# Patient Record
Sex: Male | Born: 1975 | Race: White | Hispanic: No | Marital: Single | Smoking: Former smoker
Health system: Southern US, Community
[De-identification: ages and names within clinical notes are randomized; demographics above are authoritative.]

## PROBLEM LIST (undated history)

## (undated) DIAGNOSIS — K219 Gastro-esophageal reflux disease without esophagitis: Secondary | ICD-10-CM

## (undated) DIAGNOSIS — I10 Essential (primary) hypertension: Secondary | ICD-10-CM

## (undated) DIAGNOSIS — I2510 Atherosclerotic heart disease of native coronary artery without angina pectoris: Secondary | ICD-10-CM

## (undated) DIAGNOSIS — E785 Hyperlipidemia, unspecified: Secondary | ICD-10-CM

## (undated) HISTORY — DX: Atherosclerotic heart disease of native coronary artery without angina pectoris: I25.10

## (undated) HISTORY — DX: Gastro-esophageal reflux disease without esophagitis: K21.9

## (undated) HISTORY — DX: Essential (primary) hypertension: I10

## (undated) HISTORY — DX: Hyperlipidemia, unspecified: E78.5

---

## 2008-08-10 ENCOUNTER — Emergency Department (HOSPITAL_COMMUNITY): Admission: EM | Admit: 2008-08-10 | Discharge: 2008-08-10 | Payer: Self-pay | Admitting: Emergency Medicine

## 2009-11-06 ENCOUNTER — Encounter: Admission: RE | Admit: 2009-11-06 | Discharge: 2009-11-06 | Payer: Self-pay | Admitting: Neurosurgery

## 2010-01-14 HISTORY — PX: MICRODISCECTOMY LUMBAR: SUR864

## 2010-07-29 ENCOUNTER — Other Ambulatory Visit: Payer: Self-pay | Admitting: Neurosurgery

## 2010-07-29 DIAGNOSIS — M545 Low back pain, unspecified: Secondary | ICD-10-CM

## 2010-07-29 DIAGNOSIS — M79672 Pain in left foot: Secondary | ICD-10-CM

## 2010-08-01 ENCOUNTER — Ambulatory Visit
Admission: RE | Admit: 2010-08-01 | Discharge: 2010-08-01 | Disposition: A | Discharge status: Home / Self Care | Payer: BC Managed Care – PPO | Source: Ambulatory Visit | Attending: Neurosurgery | Admitting: Neurosurgery

## 2010-08-01 DIAGNOSIS — M545 Low back pain, unspecified: Secondary | ICD-10-CM

## 2010-08-01 DIAGNOSIS — M79672 Pain in left foot: Secondary | ICD-10-CM

## 2010-08-01 MED ORDER — GADOBENATE DIMEGLUMINE 529 MG/ML IV SOLN
17.0000 mL | Freq: Once | INTRAVENOUS | Status: AC | PRN
  Administered 2010-08-01: 17 mL via INTRAVENOUS

## 2014-02-07 ENCOUNTER — Ambulatory Visit (INDEPENDENT_AMBULATORY_CARE_PROVIDER_SITE_OTHER): Payer: BLUE CROSS/BLUE SHIELD | Admitting: Family Medicine

## 2014-02-07 VITALS — BP 120/90 | HR 65 | Temp 97.9°F | Resp 16 | Ht 70.5 in | Wt 189.8 lb

## 2014-02-07 DIAGNOSIS — H6123 Impacted cerumen, bilateral: Secondary | ICD-10-CM

## 2014-02-07 NOTE — Progress Notes (Signed)
° °  Subjective:   This chart was scribed for Steve Haber, MD by Forrestine Him, [facility] Scribe. This patient was seen in room 10 and the patient's care was started 6:43 PM.    Patient ID: Steve Bennett, male    DOB: 12/06/1975, 38 y.o.   MRN: [id number]  Chief Complaint  Patient presents with   Ear Fullness    right ear feels clogged up x 1 week.  used otc meds but this did not help    HPI  HPI Comments: Steve Bennett is a 38 y.o. male without any pertinent medical history who presents to [facility] complaining of a constant, moderate clogged R ear onset 1 week that is unchanged. No recent swimming. Pt has tried OTC peroxide and rinses without any improvement for symptoms. She denies any fever, chills, or other associated symptoms. No known allergies to medications. No other concerns this visit.  Pt works for [employer] as an Chief Financial Officer.  There are no active problems to display for this patient.  History reviewed. No pertinent past medical history. History reviewed. No pertinent past surgical history. Not on File Prior to Admission medications   Not on File     Review of Systems  Constitutional: Negative for fever and chills.  HENT: Positive for ear pain. Negative for congestion.     Triage Vitals: BP 120/90 mmHg   Pulse 65   Temp(Src) 97.9 F (36.6 C) (Oral)   Resp 16   Ht 5' 10.5" (1.791 m)   Wt 189 lb 12.8 oz (86.093 kg)   BMI 26.84 kg/m2   SpO2 98%   Objective:   Physical Exam  Constitutional: He is oriented to person, place, and time. He appears well-developed and well-nourished.  HENT:  Head: Normocephalic.  Eyes: EOM are normal.  Neck: Normal range of motion.  Pulmonary/Chest: Effort normal.  Abdominal: He exhibits no distension.  Musculoskeletal: Normal range of motion.  Neurological: He is alert and oriented to person, place, and time.  Psychiatric: He has a normal mood and affect.  Nursing note and  vitals reviewed.      Assessment & Plan:   I personally performed the services described in this documentation, which was scribed in my presence. The recorded information has been reviewed and is accurate.   I spent 15 minutes lavaging the ear until finally both ears were cleared.  Resolved cerumen impaction  Signed, Carola Frost.D.

## 2014-02-07 NOTE — Patient Instructions (Signed)
Cerumen Impaction °A cerumen impaction is when the wax in your ear forms a plug. This plug usually causes reduced hearing. Sometimes it also causes an earache or dizziness. Removing a cerumen impaction can be difficult and painful. The wax sticks to the ear canal. The canal is sensitive and bleeds easily. If you try to remove a heavy wax buildup with a cotton tipped swab, you may push it in further. °Irrigation with water, suction, and small ear curettes may be used to clear out the wax. If the impaction is fixed to the skin in the ear canal, ear drops may be needed for a few days to loosen the wax. People who build up a lot of wax frequently can use ear wax removal products available in your local drugstore. °SEEK MEDICAL CARE IF:  °You develop an earache, increased hearing loss, or marked dizziness. °Document Released: 04/09/2004 Document Revised: 05/25/2011 Document Reviewed: 05/30/2009 °ExitCare® Patient Information ©2015 [employer]. This information is not intended to replace advice given to you by your health care provider. Make sure you discuss any questions you have with your health care provider. ° °

## 2015-05-03 ENCOUNTER — Encounter: Payer: Self-pay | Admitting: Physician Assistant

## 2015-05-13 ENCOUNTER — Ambulatory Visit (INDEPENDENT_AMBULATORY_CARE_PROVIDER_SITE_OTHER): Payer: BLUE CROSS/BLUE SHIELD | Admitting: Physician Assistant

## 2015-05-13 ENCOUNTER — Other Ambulatory Visit (INDEPENDENT_AMBULATORY_CARE_PROVIDER_SITE_OTHER): Payer: BLUE CROSS/BLUE SHIELD

## 2015-05-13 ENCOUNTER — Encounter: Payer: Self-pay | Admitting: Physician Assistant

## 2015-05-13 VITALS — BP 128/84 | HR 68 | Ht 70.5 in | Wt 195.4 lb

## 2015-05-13 DIAGNOSIS — R194 Change in bowel habit: Secondary | ICD-10-CM | POA: Diagnosis not present

## 2015-05-13 DIAGNOSIS — R1032 Left lower quadrant pain: Secondary | ICD-10-CM

## 2015-05-13 DIAGNOSIS — K219 Gastro-esophageal reflux disease without esophagitis: Secondary | ICD-10-CM

## 2015-05-13 DIAGNOSIS — R1031 Right lower quadrant pain: Secondary | ICD-10-CM

## 2015-05-13 DIAGNOSIS — K5909 Other constipation: Secondary | ICD-10-CM | POA: Diagnosis not present

## 2015-05-13 LAB — CBC WITH DIFFERENTIAL/PLATELET
Basophils Absolute: 0 10*3/uL (ref 0.0–0.1)
Basophils Relative: 0.8 % (ref 0.0–3.0)
Eosinophils Absolute: 0.3 10*3/uL (ref 0.0–0.7)
Eosinophils Relative: 4.5 % (ref 0.0–5.0)
HCT: 46.2 % (ref 39.0–52.0)
Hemoglobin: 15.7 g/dL (ref 13.0–17.0)
Lymphocytes Relative: 49.2 % — ABNORMAL HIGH (ref 12.0–46.0)
Lymphs Abs: 2.8 10*3/uL (ref 0.7–4.0)
MCHC: 34 g/dL (ref 30.0–36.0)
MCV: 93 fl (ref 78.0–100.0)
Monocytes Absolute: 0.5 10*3/uL (ref 0.1–1.0)
Monocytes Relative: 8.9 % (ref 3.0–12.0)
Neutro Abs: 2.1 10*3/uL (ref 1.4–7.7)
Neutrophils Relative %: 36.6 % — ABNORMAL LOW (ref 43.0–77.0)
Platelets: 358 10*3/uL (ref 150.0–400.0)
RBC: 4.96 Mil/uL (ref 4.22–5.81)
RDW: 13.2 % (ref 11.5–15.5)
WBC: 5.8 10*3/uL (ref 4.0–10.5)

## 2015-05-13 LAB — HIGH SENSITIVITY CRP: CRP, High Sensitivity: 0.43 mg/L (ref 0.000–5.000)

## 2015-05-13 MED ORDER — NA SULFATE-K SULFATE-MG SULF 17.5-3.13-1.6 GM/177ML PO SOLN: 1.0000 | Freq: Once | ORAL | Status: AC

## 2015-05-13 NOTE — Progress Notes (Signed)
i agree with the above note, plan 

## 2015-05-13 NOTE — Patient Instructions (Signed)
Please go to the basement level to have your labs drawn.  You have been scheduled for an endoscopy and colonoscopy. Please follow the written instructions given to you at your visit today. Please pick up your prep supplies at the pharmacy within the next 1-3 days. [facility] [address]  If you use inhalers (even only as needed), please bring them with you on the day of your procedure. Your physician has requested that you go to www.startemmi.com and enter the access code given to you at your visit today. This web site gives a general overview about your procedure. However, you should still follow specific instructions given to you by our office regarding your preparation for the procedure.  Continue zantac 150 mg by mouth twice daily.

## 2015-05-13 NOTE — Progress Notes (Signed)
Patient ID: Steve Bennett, male   DOB: Jul 11, 1975, 40 y.o.   MRN: [id number]   Subjective:    Patient ID: Steve Bennett, male    DOB: September 09, 1975, 40 y.o.   MRN: [id number]  HPI  Steve Bennett is a pleasant 40 year old white male, new to GI today referred by [facility] after a recent episode of prolonged nausea and vomiting. Patient says he came acutely ill about 2-1/2 weeks ago with nausea followed by 8-12 episodes of vomiting. He had associated shakes and chills and felt generally miserable for the next couple of days. He had one minor episode of diarrhea the following morning which did not persist. He says at this point she is much improved but still not 100%. Appendectomy has history of chronic GERD and says he has been on medication over the past 5-1/2 years. He generally takes Zantac 150 mg twice daily which for the most part controls his symptoms. He has no complaints of dysphagia or odynophagia. He does have periodic reflux postprandially and sometimes after lying down. He is also develops a relatively new constipation symptoms. He says he is having a bowel movement 2-3 times per week and that his stools are "pellet-like". It is been worse over the past few weeks as well. He has not noted any melena or hematochezia. He says" something doesn't feel right in my lower abdomen". He generally eats very healthy, lean meats and vegetables and drinks "tons" of water. He has not had any weight loss. Family history is negative for GI disease.  Review of Systems Pertinent positive and negative review of systems were noted in the above HPI section.  All other review of systems was otherwise negative.  Outpatient Encounter Prescriptions as of 05/13/2015  Medication Sig  . calcium carbonate (TUMS EX) 750 MG chewable tablet Chew 1 tablet by mouth daily.  . ranitidine (ZANTAC) 150 MG tablet Take 150 mg by mouth 2 (two) times daily.  . Na Sulfate-K Sulfate-Mg Sulf SOLN Take 1 kit by mouth once.   No  facility-administered encounter medications on file as of 05/13/2015.   No Known Allergies There are no active problems to display for this patient.  Social History   Social History  . Marital Status: Single    Spouse Name: N/A  . Number of Children: N/A  . Years of Education: N/A   Occupational History  . Not on file.   Social History Main Topics  . Smoking status: Never Smoker   . Smokeless tobacco: Never Used  . Alcohol Use: 0.0 oz/week    0 Standard drinks or equivalent per week     Comment: social use  . Drug Use: No  . Sexual Activity: Not on file   Other Topics Concern  . Not on file   Social History Narrative    Mr. Mizuno family history includes Cancer in his mother and sister; Diabetes in his maternal grandmother and mother; Heart disease in his father; Hyperlipidemia in his father; Stroke in his paternal grandmother.      Objective:    Filed Vitals:   05/13/15 0831  BP: 128/84  Pulse: 68    Physical Exam  well-developed white male in no acute distress, pleasant blood pressure 128/84 pulse 68 height 5 foot 10 weight 195. HEENT; nontraumatic, cephalic EOMI PERRLA sclera anicteric, Cardiovascular; regular rate and rhythm with S1-S2 no murmur or gallop, Pulmonary ;clear bilaterally, Abdomen ;soft, there is very mild bilateral lower quadrant tenderness no guarding or rebound no palpable mass or hepatosplenomegaly  bowel sounds are present, Rectal ;exam not done, Extremities; no clubbing cyanosis or edema skin warm and dry, Neuropsych; mood and affect appropriate     Assessment & Plan:   #1 40 yo WM With acute illness onset 2-1/2 weeks ago with nausea vomiting fever and chills-likely viral gastroenteritis now resolved #2 chronic GERD-on medication over the past 5+ years-rule out Barrett's #3 change in bowel habits, vague lower abdominal discomfort new onset constipation-rule out occult colon lesion.  Plan; continue Zantac 150 mg by mouth twice a  day Antireflux regimen and diet Add Benefiber one dose daily Also discussed MiraLAX for when necessary use Will schedule for EGD and colonoscopy with Dr. Ardis Hughs. Procedures discussed in detail with patient including risks and benefits and he is agreeable to proceed.   Annetta Deiss S Meerab Maselli PA-C 05/13/2015   Cc: No ref. provider found

## 2015-06-18 ENCOUNTER — Encounter: Payer: Self-pay | Admitting: Gastroenterology

## 2015-06-18 ENCOUNTER — Ambulatory Visit (AMBULATORY_SURGERY_CENTER): Payer: BLUE CROSS/BLUE SHIELD | Admitting: Gastroenterology

## 2015-06-18 VITALS — BP 108/63 | HR 65 | Temp 98.9°F | Resp 12 | Ht 70.0 in | Wt 195.0 lb

## 2015-06-18 DIAGNOSIS — D120 Benign neoplasm of cecum: Secondary | ICD-10-CM

## 2015-06-18 DIAGNOSIS — K219 Gastro-esophageal reflux disease without esophagitis: Secondary | ICD-10-CM

## 2015-06-18 DIAGNOSIS — R194 Change in bowel habit: Secondary | ICD-10-CM | POA: Diagnosis not present

## 2015-06-18 DIAGNOSIS — K2950 Unspecified chronic gastritis without bleeding: Secondary | ICD-10-CM | POA: Diagnosis not present

## 2015-06-18 DIAGNOSIS — K5909 Other constipation: Secondary | ICD-10-CM

## 2015-06-18 MED ORDER — OMEPRAZOLE 40 MG PO CPDR: 40.0000 mg | DELAYED_RELEASE_CAPSULE | Freq: Every day | ORAL | Status: DC

## 2015-06-18 MED ORDER — SODIUM CHLORIDE 0.9 % IV SOLN
500.0000 mL | INTRAVENOUS | Status: DC
Start: 2015-06-18 — End: 2015-06-18

## 2015-06-18 NOTE — Op Note (Signed)
[name] Procedure Date: 06/18/2015 2:37 PM MRN: [id number] Endoscopist: Milus Banister , MD Age: 40 Referring MD:  Date of Birth: 1975-03-29 Gender: Male Procedure:                Upper GI endoscopy Indications:              Heartburn Medicines:                Monitored Anesthesia Care Procedure:                Pre-Anesthesia Assessment:                           - Prior to the procedure, a History and Physical                            was performed, and patient medications and                            allergies were reviewed. The patient's tolerance of                            previous anesthesia was also reviewed. The risks                            and benefits of the procedure and the sedation                            options and risks were discussed with the patient.                            All questions were answered, and informed consent                            was obtained. Prior Anticoagulants: The patient has                            taken no previous anticoagulant or antiplatelet                            agents. ASA Grade Assessment: II - A patient with                            mild systemic disease. After reviewing the risks                            and benefits, the patient was deemed in                            satisfactory condition to undergo the procedure.                           After obtaining informed consent, the endoscope was  passed under direct vision. Throughout the                            procedure, the patient's blood pressure, pulse, and                            oxygen saturations were monitored continuously. The                            Model GIF-HQ190 [id number]) scope was introduced                            through the mouth, and advanced to the second part                            of duodenum. The upper GI endoscopy was                             accomplished without difficulty. The patient                            tolerated the procedure well. Scope In: Scope Out: Findings:      LA Grade B (one or more mucosal breaks greater than 5 mm, not extending       between the tops of two mucosal folds) esophagitis with no bleeding was       found in the distal esophagus.      A small hiatal hernia was present.      Localized minimal inflammation characterized by erythema was found in       the gastric antrum. Biopsies were taken with a cold forceps for       histology.      The exam was otherwise without abnormality. Complications:            No immediate complications. Estimated blood loss:                            None. Estimated Blood Loss:     Estimated blood loss: none. Impression:               - LA Grade B reflux esophagitis.                           - Small hiatal hernia.                           - Gastritis. Biopsied.                           - The examination was otherwise normal. Recommendation:           - Patient has a contact number available for                            emergencies. The signs and symptoms of potential  delayed complications were discussed with the                            patient. Return to normal activities tomorrow.                            Written discharge instructions were provided to the                            patient.                           - Resume previous diet.                           - Stop zantac twice daily and please start the                            antiacid medicine that was called in today                            (omeprazole 40mg , one pill 20-30 min prior to                            breakfast meal daily)                           - Await pathology results from stomach, will start                            on appropriate antibiotics if needed.                           - No repeat upper endoscopy. Milus Banister,  MD 06/18/2015 3:12:45 PM This report has been signed electronically. Number of Addenda: 0 Referring MD:

## 2015-06-18 NOTE — Progress Notes (Signed)
Patient awakening,vss,report to rn 

## 2015-06-18 NOTE — Progress Notes (Signed)
Called to room to assist during endoscopic procedure.  Patient ID and intended procedure confirmed with present staff. Received instructions for my participation in the procedure from the performing physician.  

## 2015-06-18 NOTE — Op Note (Signed)
[facility] Patient Name: Steve Bennett Procedure Date: 06/18/2015 2:37 PM MRN: [id number] Endoscopist: Milus Banister , MD Age: 40 Referring MD:  Date of Birth: 1975-05-30 Gender: Male Procedure:                Colonoscopy Indications:              Change in bowel habits Medicines:                Monitored Anesthesia Care Procedure:                Pre-Anesthesia Assessment:                           - Prior to the procedure, a History and Physical                            was performed, and patient medications and                            allergies were reviewed. The patient's tolerance of                            previous anesthesia was also reviewed. The risks                            and benefits of the procedure and the sedation                            options and risks were discussed with the patient.                            All questions were answered, and informed consent                            was obtained. Prior Anticoagulants: The patient has                            taken no previous anticoagulant or antiplatelet                            agents. ASA Grade Assessment: II - A patient with                            mild systemic disease. After reviewing the risks                            and benefits, the patient was deemed in                            satisfactory condition to undergo the procedure.                           After obtaining informed consent, the colonoscope  was passed under direct vision. Throughout the                            procedure, the patient's blood pressure, pulse, and                            oxygen saturations were monitored continuously. The                            Model CF-HQ190L [id number]) scope was introduced                            through the anus and advanced to the the cecum,                            identified by appendiceal orifice and ileocecal                      valve. The colonoscopy was performed without                            difficulty. The patient tolerated the procedure                            well. The quality of the bowel preparation was                            excellent. The ileocecal valve, appendiceal                            orifice, and rectum were photographed. Scope In: 2:51:35 PM Scope Out: 3:00:55 PM Scope Withdrawal Time: 0 hours 6 minutes 24 seconds  Total Procedure Duration: 0 hours 9 minutes 20 seconds  Findings:      A 3 mm polyp was found in the cecum. The polyp was sessile. The polyp       was removed with a cold snare. Resection and retrieval were complete.      The exam was otherwise without abnormality on direct and retroflexion       views. Complications:            No immediate complications. Estimated blood loss:                            None. Estimated Blood Loss:     Estimated blood loss: none. Impression:               - One 3 mm polyp in the cecum, removed with a cold                            snare. Resected and retrieved.                           - The examination was otherwise normal on direct                            and  retroflexion views. Recommendation:           - Patient has a contact number available for                            emergencies. The signs and symptoms of potential                            delayed complications were discussed with the                            patient. Return to normal activities tomorrow.                            Written discharge instructions were provided to the                            patient.                           - Resume previous diet.                           - Continue present medications.                           You will receive a letter within 2-3 weeks with the                            pathology results and my final recommendations.                           If the polyp(s) is proven to be 'pre-cancerous'  on                            pathology, you will need repeat colonoscopy in 5                            years. If the polyp(s) is NOT 'precancerous' on                            pathology then you should repeat colon cancer                            screening in 10 years with colonoscopy without need                            for colon cancer screening by any method prior to                            then (including stool testing). Milus Banister, MD 06/18/2015 3:03:21 PM This report has been signed electronically. Number of Addenda: 0 Referring MD:

## 2015-06-18 NOTE — Patient Instructions (Signed)

## 2015-06-19 ENCOUNTER — Telehealth: Payer: Self-pay | Admitting: *Deleted

## 2015-06-19 NOTE — Telephone Encounter (Signed)
Unable to leave message on f/u call. This number does not receive messages

## 2015-06-28 ENCOUNTER — Encounter: Payer: Self-pay | Admitting: Gastroenterology

## 2016-02-28 DIAGNOSIS — R197 Diarrhea, unspecified: Secondary | ICD-10-CM | POA: Diagnosis not present

## 2016-02-28 DIAGNOSIS — J00 Acute nasopharyngitis [common cold]: Secondary | ICD-10-CM | POA: Diagnosis not present

## 2016-06-05 ENCOUNTER — Other Ambulatory Visit: Payer: Self-pay | Admitting: Gastroenterology

## 2016-08-31 ENCOUNTER — Emergency Department (HOSPITAL_BASED_OUTPATIENT_CLINIC_OR_DEPARTMENT_OTHER)
Admission: EM | Admit: 2016-08-31 | Discharge: 2016-08-31 | Disposition: A | Discharge status: Home / Self Care | Payer: BLUE CROSS/BLUE SHIELD | Attending: Emergency Medicine | Admitting: Emergency Medicine

## 2016-08-31 ENCOUNTER — Encounter (HOSPITAL_BASED_OUTPATIENT_CLINIC_OR_DEPARTMENT_OTHER): Payer: Self-pay | Admitting: *Deleted

## 2016-08-31 DIAGNOSIS — R42 Dizziness and giddiness: Secondary | ICD-10-CM | POA: Diagnosis not present

## 2016-08-31 DIAGNOSIS — Z79899 Other long term (current) drug therapy: Secondary | ICD-10-CM | POA: Diagnosis not present

## 2016-08-31 LAB — URINALYSIS, ROUTINE W REFLEX MICROSCOPIC
Bilirubin Urine: NEGATIVE
Glucose, UA: NEGATIVE mg/dL
Hgb urine dipstick: NEGATIVE
Ketones, ur: NEGATIVE mg/dL
Leukocytes, UA: NEGATIVE
Nitrite: NEGATIVE
Protein, ur: NEGATIVE mg/dL
Specific Gravity, Urine: 1.009 (ref 1.005–1.030)
pH: 7 (ref 5.0–8.0)

## 2016-08-31 LAB — CBG MONITORING, ED: Glucose-Capillary: 103 mg/dL — ABNORMAL HIGH (ref 65–99)

## 2016-08-31 LAB — BASIC METABOLIC PANEL
Anion gap: 10 (ref 5–15)
BUN: 14 mg/dL (ref 6–20)
CO2: 27 mmol/L (ref 22–32)
Calcium: 9.4 mg/dL (ref 8.9–10.3)
Chloride: 103 mmol/L (ref 101–111)
Creatinine, Ser: 0.99 mg/dL (ref 0.61–1.24)
GFR calc Af Amer: >60 mL/min (ref >60)
GFR calc non Af Amer: >60 mL/min (ref >60)
Glucose, Bld: 104 mg/dL — ABNORMAL HIGH (ref 65–99)
Potassium: 3.6 mmol/L (ref 3.5–5.1)
Sodium: 140 mmol/L (ref 135–145)

## 2016-08-31 LAB — CBC
HCT: 41.8 % (ref 39.0–52.0)
Hemoglobin: 15 g/dL (ref 13.0–17.0)
MCH: 32.5 pg (ref 26.0–34.0)
MCHC: 35.9 g/dL (ref 30.0–36.0)
MCV: 90.7 fL (ref 78.0–100.0)
Platelets: 291 10*3/uL (ref 150–400)
RBC: 4.61 MIL/uL (ref 4.22–5.81)
RDW: 13.3 % (ref 11.5–15.5)
WBC: 6 10*3/uL (ref 4.0–10.5)

## 2016-08-31 NOTE — ED Provider Notes (Signed)
[facility] DEPT [facility] Provider Note   CSN: [id number] Arrival date & time: 08/31/16  1554  By signing my name below, I, Steve Bennett, attest that this documentation has been prepared under the direction and in the presence of  Carmon Sails, PA-C. Electronically Signed: Hansel Bennett, ED Scribe. 08/31/16. 6:12 PM.    History   Chief Complaint Chief Complaint  Patient presents with  . Dizziness    HPI Steve Bennett is a 41 y.o. male who presents to the Emergency Department complaining of an episode of transient lightheadedness ("feeling like passing out") that occurred at 1 pm at work during a meeting and lasted an hour. Pt reports during the episode of lightheadedness, he noticed his palms sweating and cold, found himself to be nauseated and slower performing any activity. He describes himself feeling as though he "had to focus more" on tasks like speaking or walking. Pt states he has been well hydrated today (3L water) and ate normally, lunch 1 hr before symptoms started. He reports h/o 3 episodes of syncope related to vasovagal reactions from needles, but no syncope related to cardiac abnormalities. He states all symptoms have currently resolved aside from a mild, aching generalized HA. Pt states he has felt normal for the last few weeks leading up to his symptoms. No recent changes in medications. He denies dizziness, vomiting, CP, SOB, palpitations, numbness, unilateral weakness, vision disturbance, gait or balance, speech difficulty during the episode of lightheadedness. He is otherwise healthy on Omeprazol daily.     The history is provided by the patient. No language interpreter was used.    History reviewed. No pertinent past medical history.  There are no active problems to display for this patient.   Past Surgical History:  Procedure Laterality Date  . MICRODISCECTOMY LUMBAR  01/2010       Home Medications    Prior to Admission medications   Medication Sig  Start Date End Date Taking? Authorizing Provider  omeprazole (PRILOSEC) 40 MG capsule TAKE 1 CAPSULE (40 MG TOTAL) BY MOUTH DAILY BEFORE BREAKFAST. 06/05/16  Yes Milus Banister, MD  calcium carbonate (TUMS EX) 750 MG chewable tablet Chew 1 tablet by mouth daily.    [provider]    Family History Family History  Problem Relation Age of Onset  . Cancer Mother   . Diabetes Mother   . Heart disease Father   . Hyperlipidemia Father   . Cancer Sister   . Diabetes Maternal Grandmother   . Stroke Paternal Grandmother     Social History Social History  Substance Use Topics  . Smoking status: Never Smoker  . Smokeless tobacco: Never Used  . Alcohol use 0.0 oz/week     Comment: social use     Allergies   Patient has no known allergies.   Review of Systems Review of Systems  Constitutional: Negative for chills and fever.  HENT: Negative for congestion and sore throat.   Eyes: Negative for visual disturbance.  Respiratory: Negative for cough, chest tightness and shortness of breath.   Cardiovascular: Negative for chest pain and palpitations.  Gastrointestinal: Positive for nausea (resolved). Negative for abdominal pain, constipation, diarrhea and vomiting.  Genitourinary: Negative for difficulty urinating, dysuria, frequency and hematuria.  Musculoskeletal: Negative for gait problem and myalgias.  Skin: Negative for rash.  Allergic/Immunologic: Negative for immunocompromised state.  Neurological: Positive for light-headedness (resolved) and headaches. Negative for dizziness, syncope, weakness and numbness.  Hematological: Does not bruise/bleed easily.  Psychiatric/Behavioral: Negative for confusion.  Physical Exam Updated Vital Signs BP (!) 141/88   Pulse 72   Temp 99.1 F (37.3 C) (Oral)   Resp 14   Ht 5\' 11"  (1.803 m)   Wt 89.8 kg (198 lb)   SpO2 99%   BMI 27.62 kg/m   Physical Exam  Constitutional: He is oriented to person, place, and time. He  appears well-developed and well-nourished. No distress.  HENT:  Head: Normocephalic and atraumatic.  Nose: Nose normal.  Mouth/Throat: Oropharynx is clear and moist. No oropharyngeal exudate.  Eyes: Conjunctivae and EOM are normal. Pupils are equal, round, and reactive to light.  Neck: Normal range of motion. Neck supple. No JVD present. No tracheal deviation present.  Cardiovascular: Normal rate, regular rhythm, S1 normal, S2 normal, normal heart sounds and intact distal pulses.   No murmur heard. SBP and HR wnl. Carotid pulses 2+ bilaterally without bruits. Good S1 and S2, no extra sounds or murmurs. 2+ and symmetric carotid, radial, femoral, DP and PT pulses bilaterally.  Capillary refill brisk in upper extremities.  No varicosities seen. No lower extremity edema.  No orthopnea.  Good lung sounds without crackles.  Pulmonary/Chest: Effort normal and breath sounds normal. No respiratory distress. He has no wheezes. He has no rales.  Abdominal: Soft. Bowel sounds are normal. He exhibits no distension. There is no tenderness.  Musculoskeletal: Normal range of motion. He exhibits no deformity.  Lymphadenopathy:    He has no cervical adenopathy.  Neurological: He is alert and oriented to person, place, and time.  Pt is alert and oriented.   Speech and phonation normal.   Thought process coherent.   Strength 5/5 in upper and lower extremities.   Sensation to light touch intact in upper and lower extremities.  Gait normal.   Negative Romberg. No leg drift.  Intact finger to nose test. CN I not tested CN II full visual fields  CN III, IV, VI PEERL and EOM intact CN V light touch intact in all 3 divisions of trigeminal nerve CN VII facial nerve movements intact, symmetric CN VIII hearing intact to finger rub CN IX, X no uvula deviation, symmetric soft palate rise CN XI 5/5 SCM and trapezius strength  CN XII Tongue midline with symmetric L/R movement   Skin: Skin is warm and dry.  Capillary refill takes less than 2 seconds.  Psychiatric: He has a normal mood and affect. His behavior is normal. Judgment and thought content normal.  Nursing note and vitals reviewed.    ED Treatments / Results   DIAGNOSTIC STUDIES: Oxygen Saturation is 99% on RA, normal by my interpretation.    COORDINATION OF CARE: 6:08 PM Discussed treatment plan with pt at bedside which includes labs and pt agreed to plan.    Labs (all labs ordered are listed, but only abnormal results are displayed) Labs Reviewed  BASIC METABOLIC PANEL - Abnormal; Notable for the following:       Result Value   Glucose, Bld 104 (*)    All other components within normal limits  CBC  URINALYSIS, ROUTINE W REFLEX MICROSCOPIC  CBG MONITORING, ED    EKG  EKG Interpretation  Date/Time:  Monday August 31 2016 17:18:34 EDT Ventricular Rate:  72 PR Interval:    QRS Duration: 86 QT Interval:  367 QTC Calculation: 402 R Axis:   62 Text Interpretation:  Sinus rhythm No previous ECGs available Confirmed by Fredia Sorrow [phone]) on 08/31/2016 6:09:38 PM       Radiology No results found.  Procedures Procedures (including critical care time)  Medications Ordered in ED Medications - No data to display   Initial Impression / Assessment and Plan / ED Course  I have reviewed the triage vital signs and the nursing notes.  Pertinent labs & imaging results that were available during my care of the patient were reviewed by me and considered in my medical decision making (see chart for details).    41 y.o. yo male with pertinent pmh of GERD presents with short lasting episode of light-headedness "feeling like passing out" for one hour today.  Symptoms resolved. Associated symptoms include nausea, clammy/cold hands.  Patient noted it felt "harder to talk and focus".  He reports 2-3 vasovagal episodes in the past. No red flag symptoms including focal weakness or sensory deficits to extremities, sudden  onset/severe headache, associated LOC, pain anywhere, shortness of breath, chest pain, palpitations.  No new medications or medication changes.  Medications reviewed, none have adverse effects that could be contributing.  Neuro exam normal, HiNTS exam normal. Patient denies vertiginous symptoms with head rotation.  No diplopia, dysarthria, dysphagia, dysmetria, weakness or sensory loss to extremities.  I personally ambulated patient who had a steady gait without ataxia or sudden onset of nausea or vomiting.  I did not appreciate any tremors or shakiness while walking. ENT exam normal.  VS reassuring.  No hyponatremia or other electrolyte abnormalities,EKG without arrythmias/ischemia, no ketones or nitrites in urine.   Patient is considered safe to discharge at this time.  I do not think further lab work or emergent imaging is indicated at this time.  Doubt central cause of symptoms including stroke or TIA.  Doubt cardiac arrhythmia, polypharmacy, vestibular syndrome, BPPV.  Patient was given any in ED as symptoms had resvoled Prior to discharge patient has reassuring vital signs.  Will discharge with close PCP f/u, strict ED return precautions.  Patient aware of red flag symptoms to monitor for that would warrant return to ED.   Patient, ED treatment and discharge plan was discussed with supervising physician who is agreeable with plan.   Final Clinical Impressions(s) / ED Diagnoses   Final diagnoses:  Lightheadedness    New Prescriptions New Prescriptions   No medications on file    I personally performed the services described in this documentation, which was scribed in my presence. The recorded information has been reviewed and is accurate.    Kinnie Feil, PA-C 08/31/16 2031    Fredia Sorrow, MD 09/10/16 [phone]

## 2016-08-31 NOTE — ED Notes (Addendum)
Assumed care of patient from [place], [place]. Pt resting quietly. NO distress. Call bell within reach. Pt states he feels much better. Denies dizziness. No distress. Updated on delay, PA advised.

## 2016-08-31 NOTE — Discharge Instructions (Signed)
Your lab work and EKG were normal and reassuring today.  It is still unclear what could have caused your symptoms. Your heart rhythm, electrolytes, urinalysis were normal. Your vital signs remained within normal limits while in observation today. You were also able to walk without return of symptoms.  Please ensure you drink plenty of water and monitor your symptoms.   Return to the ED for return or worsening of symptoms.

## 2016-08-31 NOTE — ED Triage Notes (Signed)
3 hours ago at work he felt sluggish and tense. EMS was called. He was not transported at the that time. He continues to "not feel right". Alert oriented. Neuro intact.

## 2016-11-04 DIAGNOSIS — R109 Unspecified abdominal pain: Secondary | ICD-10-CM | POA: Diagnosis not present

## 2017-01-01 DIAGNOSIS — Z0000 Encounter for general adult medical examination without abnormal findings: Secondary | ICD-10-CM | POA: Diagnosis not present

## 2017-07-04 ENCOUNTER — Other Ambulatory Visit: Payer: Self-pay | Admitting: Gastroenterology

## 2018-01-13 DIAGNOSIS — Z0000 Encounter for general adult medical examination without abnormal findings: Secondary | ICD-10-CM | POA: Diagnosis not present

## 2018-01-14 DIAGNOSIS — Z0000 Encounter for general adult medical examination without abnormal findings: Secondary | ICD-10-CM | POA: Diagnosis not present

## 2018-01-14 DIAGNOSIS — M25519 Pain in unspecified shoulder: Secondary | ICD-10-CM | POA: Diagnosis not present

## 2018-01-14 DIAGNOSIS — L74519 Primary focal hyperhidrosis, unspecified: Secondary | ICD-10-CM | POA: Diagnosis not present

## 2018-01-14 DIAGNOSIS — K219 Gastro-esophageal reflux disease without esophagitis: Secondary | ICD-10-CM | POA: Diagnosis not present

## 2018-01-31 DIAGNOSIS — M67911 Unspecified disorder of synovium and tendon, right shoulder: Secondary | ICD-10-CM | POA: Diagnosis not present

## 2018-01-31 DIAGNOSIS — M67912 Unspecified disorder of synovium and tendon, left shoulder: Secondary | ICD-10-CM | POA: Diagnosis not present

## 2018-06-14 DIAGNOSIS — R0789 Other chest pain: Secondary | ICD-10-CM | POA: Diagnosis not present

## 2018-06-22 ENCOUNTER — Telehealth: Payer: Self-pay | Admitting: *Deleted

## 2018-06-22 NOTE — Telephone Encounter (Signed)
REFERRAL SENT TO SCHEDULING AND NOTES ON FILE FROM DR. ALAN ROSS [phone].

## 2018-07-04 ENCOUNTER — Telehealth: Payer: Self-pay

## 2018-07-04 DIAGNOSIS — R079 Chest pain, unspecified: Secondary | ICD-10-CM | POA: Insufficient documentation

## 2018-07-04 DIAGNOSIS — Z7189 Other specified counseling: Secondary | ICD-10-CM | POA: Insufficient documentation

## 2018-07-04 DIAGNOSIS — R0789 Other chest pain: Principal | ICD-10-CM

## 2018-07-04 NOTE — Telephone Encounter (Signed)
YOUR CARDIOLOGY TEAM HAS ARRANGED FOR AN E-VISIT FOR YOUR APPOINTMENT - PLEASE REVIEW IMPORTANT INFORMATION BELOW SEVERAL DAYS PRIOR TO YOUR APPOINTMENT  Due to the recent COVID-19 pandemic, we are transitioning in-person office visits to tele-medicine visits in an effort to decrease unnecessary exposure to our patients, their families, and staff. These visits are billed to your insurance just like a normal visit is. We also encourage you to sign up for MyChart if you have not already done so. You will need a smartphone if possible. For patients that do not have this, we can still complete the visit using a regular telephone but do prefer a smartphone to enable video when possible. You may have a family member that lives with you that can help. If possible, we also ask that you have a blood pressure cuff and scale at home to measure your blood pressure, heart rate and weight prior to your scheduled appointment. Patients with clinical needs that need an in-person evaluation and testing will still be able to come to the office if absolutely necessary. If you have any questions, feel free to call our office.     YOUR PROVIDER WILL BE USING THE FOLLOWING PLATFORM TO COMPLETE YOUR VISIT: Yes  . IF USING WEBEX - How to Download the WebEx App to Your SmartPhone  - If Apple device, go to [employer] and type in WebEx in the search bar. [place] [employer], the blue/green circle. If Android, go to [employer] and type in [employer] in the search bar. The app is free but as with any other app download, your phone may require you to verify saved payment information or Apple/Android password.  - You do NOT have to create an account. - On the day of the visit, our staff will walk you through joining the meeting with the meeting number/password.  Deloris Ping USING MYCHART - How to Download the MyChart App to Your SmartPhone   - If Apple, go to [employer] and type in MyChart in the search bar and download the  app. If Android, ask patient to go to [employer] and type in [place] in the search bar and download the app. The app is free but as with any other app downloads, your phone may require you to verify saved payment information or Apple/Android password.  - You will need to then log into the app with your MyChart username and password, and select [facility] as your healthcare provider to link the account. When it is time for your visit, go to the MyChart app, find appointments, and click Begin Video Visit. Be sure to Select Allow for your device to access the Microphone and Camera for your visit. You will then be connected, and your provider will be with you shortly.  **If you have any issues connecting or need assistance, please contact MyChart service desk [phone])**  **If using a computer, in order to ensure the best quality for your visit, you will need to use either of the following Internet Browsers: [employer], or [employer]**  . IF USING DOXIMITY or DOXY.ME - The staff will give you instructions on receiving your link to join the meeting the day of your visit.      2-3 DAYS BEFORE YOUR APPOINTMENT  You will receive a telephone call from one of our [facility] team members - your caller ID may say "Unknown caller." If this is a video visit, we will walk you through how to set  up your device to be able to complete the visit. We will remind you check your blood pressure, heart rate and weight prior to your scheduled appointment. If you have an Apple Watch or Kardia, please upload any pertinent ECG strips the day before or morning of your appointment to [facility]. Our staff will also make sure you have reviewed the consent and agree to move forward with your scheduled tele-health visit.     THE DAY OF YOUR APPOINTMENT  Approximately 15 minutes prior to your scheduled appointment, you will receive a telephone call from one of [facility] team - your caller ID may say  "Unknown caller."  Our staff will confirm medications, vital signs for the day and any symptoms you may be experiencing. Please have this information available prior to the time of visit start. It may also be helpful for you to have a pad of paper and pen handy for any instructions given during your visit. They will also walk you through joining the smartphone meeting if this is a video visit.    CONSENT FOR TELE-HEALTH VISIT - PLEASE REVIEW  I hereby voluntarily request, consent and authorize [facility] and its employed or contracted physicians, physician assistants, nurse practitioners or other licensed health care professionals (the Practitioner), to provide me with telemedicine health care services (the "Services") as deemed necessary by the treating Practitioner. I acknowledge and consent to receive the Services by the Practitioner via telemedicine. I understand that the telemedicine visit will involve communicating with the Practitioner through live audiovisual communication technology and the disclosure of certain medical information by electronic transmission. I acknowledge that I have been given the opportunity to request an in-person assessment or other available alternative prior to the telemedicine visit and am voluntarily participating in the telemedicine visit.  I understand that I have the right to withhold or withdraw my consent to the use of telemedicine in the course of my care at any time, without affecting my right to future care or treatment, and that the Practitioner or I may terminate the telemedicine visit at any time. I understand that I have the right to inspect all information obtained and/or recorded in the course of the telemedicine visit and may receive copies of available information for a reasonable fee.  I understand that some of the potential risks of receiving the Services via telemedicine include:  Marland Kitchen Delay or interruption in medical evaluation due to technological  equipment failure or disruption; . Information transmitted may not be sufficient (e.g. poor resolution of images) to allow for appropriate medical decision making by the Practitioner; and/or  . In rare instances, security protocols could fail, causing a breach of personal health information.  Furthermore, I acknowledge that it is my responsibility to provide information about my medical history, conditions and care that is complete and accurate to the best of my ability. I acknowledge that Practitioner's advice, recommendations, and/or decision may be based on factors not within their control, such as incomplete or inaccurate data provided by me or distortions of diagnostic images or specimens that may result from electronic transmissions. I understand that the practice of medicine is not an exact science and that Practitioner makes no warranties or guarantees regarding treatment outcomes. I acknowledge that I will receive a copy of this consent concurrently upon execution via email to the email address I last provided but may also request a printed copy by calling the office of [facility].    I understand that my insurance will be billed for  this visit.   I have read or had this consent read to me. . I understand the contents of this consent, which adequately explains the benefits and risks of the Services being provided via telemedicine.  . I have been provided ample opportunity to ask questions regarding this consent and the Services and have had my questions answered to my satisfaction. . I give my informed consent for the services to be provided through the use of telemedicine in my medical care  By participating in this telemedicine visit I agree to the above.

## 2018-07-04 NOTE — Progress Notes (Signed)
Virtual Visit via Video Note   This visit type was conducted due to national recommendations for restrictions regarding the COVID-19 Pandemic (e.g. social distancing) in an effort to limit this patient's exposure and mitigate transmission in our community.  Due to his co-morbid illnesses, this patient is at least at moderate risk for complications without adequate follow up.  This format is felt to be most appropriate for this patient at this time.  All issues noted in this document were discussed and addressed.  A limited physical exam was performed with this format.  Please refer to the patient's chart for his consent to telehealth for [facility].   Evaluation Performed:  Follow-up visit  Date:  07/05/2018   ID:  Steve Bennett, DOB 08/19/1975, MRN [id number]  Patient Location: Home Provider Location: Home  PCP:  Patient, No Pcp Per  Cardiologist: Melinda Crutch, MD Electrophysiologist:  None   Chief Complaint:  Chest Pain  History of Present Illness:    Steve Bennett is a 43 y.o. male with essential hypertension who is referred by Dr. Harrington Challenger for evaluation of chest pain.  The discomfort is precordial. It is heavy and has only occurred early in the morning when he awakens. It lasts < 90 seconds. It is not precipitated by exercise. He walks up to 5 miles daily. Feels he is a little SOB when walking but does not know if this pollen or something else. Worried he could have a virus. Denies chills, fever, and cough.  The patient does not have symptoms concerning for COVID-19 infection (fever, chills, cough, or new shortness of breath).    No past medical history on file. Past Surgical History:  Procedure Laterality Date  . MICRODISCECTOMY LUMBAR  01/2010     Current Meds  Medication Sig  . cetirizine (ZYRTEC) 10 MG tablet Take 10 mg by mouth daily.  Marland Kitchen omeprazole (PRILOSEC) 20 MG capsule Take 20 mg by mouth 2 (two) times daily before a meal.     Allergies:   Patient has  no known allergies.   Social History   Tobacco Use  . Smoking status: Never Smoker  . Smokeless tobacco: Never Used  Substance Use Topics  . Alcohol use: Yes    Alcohol/week: 0.0 standard drinks    Comment: social use  . Drug use: No     Family Hx: The patient's family history includes Cancer in his mother and sister; Diabetes in his maternal grandmother and mother; Heart disease in his father; Hyperlipidemia in his father; Stroke in his paternal grandmother.  ROS:   Please see the history of present illness.    No chronic medical illnesses.  He is an Chief Financial Officer for [employer].  He is currently working.  He is able to work from home. All other systems reviewed and are negative.   Prior CV studies:   The following studies were reviewed today:  No history of cardiac evaluation.  Labs/Other Tests and Data Reviewed:    EKG:  An ECG dated 2018 was personally reviewed today and demonstrated:  Normal sinus rhythm with normal appearance.  States that a recent EKG performed by Dr. Melinda Crutch was also said to be "normal".  Recent Labs: No results found for requested labs within last 8760 hours.   Recent Lipid Panel No results found for: CHOL, TRIG, HDL, CHOLHDL, LDLCALC, LDLDIRECT  Wt Readings from Last 3 Encounters:  07/05/18 200 lb (90.7 kg)  08/31/16 198 lb (89.8 kg)  06/18/15 195 lb (88.5 kg)  Objective:    Vital Signs:  Ht 5\' 11"  (1.803 m)   Wt 200 lb (90.7 kg)   BMI 27.89 kg/m    VITAL SIGNS:  reviewed Healthy-appearing.  No difficulty with dyspnea.  Nailbeds reveal no evidence of cyanosis.  There is no swelling.  No neck vein elevation is observed.  ASSESSMENT & PLAN:    1. Chest pain of uncertain etiology   2. Essential hypertension   3. Hyperlipidemia LDL goal <100   4. Educated About Covid-19 Virus Infection    PLAN:  1. The chest discomfort is atypical.  The chest discomfort is short-lived.  The chest discomfort is not precipitated by a continuous moderate  physical activity as and walking 5 miles on a regular basis during the COVID 19 pandemic.  He does have male gender, borderline blood pressure, and positive family history of CAD (father had an MI prior to age 59).  Plan expedited exercise treadmill test within the next 2 weeks.  Coronary calcium score will be done to help assess long-term risk given family history. 2. States his blood pressure has been in the yellow zone.  We obviously have no information today as he is unable to record blood pressures at home. 3. He also feels that his lipid levels have been borderline.  We will obtain information from Dr. Melinda Crutch. 4. He understands social distancing.  COVID-19 Education: The signs and symptoms of COVID-19 were discussed with the patient and how to seek care for testing (follow up with PCP or arrange E-visit).  The importance of social distancing was discussed today.  Time:   Today, I have spent 24 minutes with the patient with telehealth technology discussing the above problems.     Medication Adjustments/Labs and Tests Ordered: Current medicines are reviewed at length with the patient today.  Concerns regarding medicines are outlined above.   Tests Ordered: Orders Placed This Encounter  Procedures  . CT CARDIAC SCORING  . Exercise Tolerance Test    Medication Changes: No orders of the defined types were placed in this encounter.   Disposition:  Follow up in 3 month(s) if needed based on workup.  Signed, Sinclair Grooms, MD  07/05/2018 1:57 PM    [facility]

## 2018-07-05 ENCOUNTER — Other Ambulatory Visit: Payer: Self-pay

## 2018-07-05 ENCOUNTER — Encounter: Payer: Self-pay | Admitting: Interventional Cardiology

## 2018-07-05 ENCOUNTER — Telehealth (INDEPENDENT_AMBULATORY_CARE_PROVIDER_SITE_OTHER): Payer: BLUE CROSS/BLUE SHIELD | Admitting: Interventional Cardiology

## 2018-07-05 VITALS — Ht 71.0 in | Wt 200.0 lb

## 2018-07-05 DIAGNOSIS — R079 Chest pain, unspecified: Secondary | ICD-10-CM

## 2018-07-05 DIAGNOSIS — Z7189 Other specified counseling: Secondary | ICD-10-CM

## 2018-07-05 DIAGNOSIS — R0789 Other chest pain: Secondary | ICD-10-CM | POA: Diagnosis not present

## 2018-07-05 DIAGNOSIS — R072 Precordial pain: Secondary | ICD-10-CM

## 2018-07-05 DIAGNOSIS — E785 Hyperlipidemia, unspecified: Secondary | ICD-10-CM

## 2018-07-05 DIAGNOSIS — I10 Essential (primary) hypertension: Secondary | ICD-10-CM

## 2018-07-05 NOTE — Patient Instructions (Signed)
Medication Instructions:  Your physician recommends that you continue on your current medications as directed. Please refer to the Current Medication list given to you today.  If you need a refill on your cardiac medications before your next appointment, please call your pharmacy.   Lab work: None If you have labs (blood work) drawn today and your tests are completely normal, you will receive your results only by: Marland Kitchen MyChart Message (if you have MyChart) OR . A paper copy in the mail If you have any lab test that is abnormal or we need to change your treatment, we will call you to review the results.  Testing/Procedures: Your physician has requested that you have an exercise tolerance test. For further information please visit [email or web address]. Please also follow instruction sheet, as given.  Your physician recommends that you have a Calcium Score performed.   Follow-Up: Your physician recommends that you schedule a follow-up appointment as needed with Dr. Tamala Julian   Any Other Special Instructions Will Be Listed Below (If Applicable).

## 2018-07-06 ENCOUNTER — Telehealth: Payer: BLUE CROSS/BLUE SHIELD | Admitting: Interventional Cardiology

## 2018-07-07 ENCOUNTER — Telehealth: Payer: Self-pay | Admitting: Interventional Cardiology

## 2018-07-07 NOTE — Telephone Encounter (Signed)
Spoke with pt and made him aware that I was waiting to hear back from the nuclear dept on what we are doing about testing.  Advised I did hear back this morning and had to send a message to Dr. Tamala Julian.  Advised I will call as soon as I have further instructions.  Pt verbalized understanding and was appreciative for call.

## 2018-07-07 NOTE — Telephone Encounter (Signed)
Per pt call - stated following up on the call his is waiting on to set up some test..  Pt stated he would like to get them done soon if possible.  Pt would like a call back he has some questions about this please.

## 2018-07-07 NOTE — Addendum Note (Signed)
Addended by: Loren Racer on: 07/07/2018 03:03 PM   Modules accepted: Orders

## 2018-07-08 NOTE — Telephone Encounter (Signed)
Heard from Dr. Tamala Julian and he would like to switch to a Lexiscan.  Order placed and message sent to supervisor to arrange appt.  I have spoken with the pt and went over instructions.

## 2018-07-11 ENCOUNTER — Other Ambulatory Visit: Payer: Self-pay

## 2018-07-11 ENCOUNTER — Other Ambulatory Visit (HOSPITAL_COMMUNITY): Payer: Self-pay

## 2018-07-11 ENCOUNTER — Other Ambulatory Visit: Payer: Self-pay | Admitting: Cardiovascular Disease

## 2018-07-13 ENCOUNTER — Other Ambulatory Visit: Payer: Self-pay

## 2018-07-13 ENCOUNTER — Ambulatory Visit (HOSPITAL_COMMUNITY): Payer: BLUE CROSS/BLUE SHIELD | Attending: Internal Medicine

## 2018-07-13 DIAGNOSIS — R072 Precordial pain: Secondary | ICD-10-CM | POA: Insufficient documentation

## 2018-07-13 DIAGNOSIS — R0789 Other chest pain: Secondary | ICD-10-CM | POA: Diagnosis not present

## 2018-07-13 DIAGNOSIS — R079 Chest pain, unspecified: Secondary | ICD-10-CM

## 2018-07-13 LAB — MYOCARDIAL PERFUSION IMAGING
LV dias vol: 121 mL (ref 62–150)
LV sys vol: 55 mL
Peak HR: 108 {beats}/min
Rest HR: 79 {beats}/min
SDS: 0
SRS: 0
SSS: 0
TID: 0.92

## 2018-07-13 MED ORDER — REGADENOSON 0.4 MG/5ML IV SOLN
0.4000 mg | Freq: Once | INTRAVENOUS | Status: AC
  Administered 2018-07-13: 0.4 mg via INTRAVENOUS

## 2018-07-13 MED ORDER — TECHNETIUM TC 99M TETROFOSMIN IV KIT
8.4000 | PACK | Freq: Once | INTRAVENOUS | Status: AC | PRN
  Administered 2018-07-13: 8.4 via INTRAVENOUS
  Filled 2018-07-13: qty 9

## 2018-07-13 MED ORDER — TECHNETIUM TC 99M TETROFOSMIN IV KIT
32.8000 | PACK | Freq: Once | INTRAVENOUS | Status: AC | PRN
  Administered 2018-07-13: 32.8 via INTRAVENOUS
  Filled 2018-07-13: qty 33

## 2018-07-14 ENCOUNTER — Telehealth: Payer: Self-pay | Admitting: Interventional Cardiology

## 2018-07-14 NOTE — Telephone Encounter (Signed)
Left message to call back  

## 2018-07-14 NOTE — Telephone Encounter (Signed)
Informed pt of results. Pt verbalized understanding. 

## 2018-07-14 NOTE — Telephone Encounter (Signed)
Patient is returning call he thinks from [employer]

## 2018-07-14 NOTE — Telephone Encounter (Signed)
Follow Up:; ° ° °Returning your call. °

## 2018-07-27 ENCOUNTER — Telehealth: Payer: Self-pay | Admitting: Interventional Cardiology

## 2018-07-27 NOTE — Telephone Encounter (Signed)
New Message ° ° ° °Pt is returning call  ° ° ° °Please call back  °

## 2018-07-27 NOTE — Telephone Encounter (Signed)
Spoke with pt and he said he wasn't returning a call.  He was calling to inquire about scheduling Calcium Score.  Advised I have not heard anything about those being scheduled at this time but I would send message to [place] dept and have them reach out to him if possible to schedule.  Pt appreciative for call.

## 2018-08-22 ENCOUNTER — Telehealth: Payer: Self-pay | Admitting: *Deleted

## 2018-08-22 NOTE — Telephone Encounter (Signed)

## 2018-08-23 ENCOUNTER — Other Ambulatory Visit: Payer: Self-pay

## 2018-08-23 ENCOUNTER — Ambulatory Visit (INDEPENDENT_AMBULATORY_CARE_PROVIDER_SITE_OTHER)
Admission: RE | Admit: 2018-08-23 | Discharge: 2018-08-23 | Disposition: A | Discharge status: Home / Self Care | Payer: Self-pay | Source: Ambulatory Visit | Attending: Interventional Cardiology | Admitting: Interventional Cardiology

## 2018-08-23 DIAGNOSIS — R079 Chest pain, unspecified: Secondary | ICD-10-CM

## 2018-08-23 DIAGNOSIS — R0789 Other chest pain: Secondary | ICD-10-CM

## 2018-08-23 DIAGNOSIS — I10 Essential (primary) hypertension: Secondary | ICD-10-CM

## 2018-08-25 ENCOUNTER — Telehealth: Payer: Self-pay | Admitting: Interventional Cardiology

## 2018-08-25 NOTE — Telephone Encounter (Signed)
New Message     Pt is returning a call to [facility]    Please call back

## 2018-08-25 NOTE — Telephone Encounter (Signed)
Informed pt of results. Pt verbalized understanding. 

## 2018-08-29 DIAGNOSIS — R079 Chest pain, unspecified: Secondary | ICD-10-CM | POA: Diagnosis not present

## 2018-08-29 DIAGNOSIS — R6889 Other general symptoms and signs: Secondary | ICD-10-CM | POA: Diagnosis not present

## 2018-09-05 ENCOUNTER — Other Ambulatory Visit: Payer: Self-pay | Admitting: Family Medicine

## 2018-09-05 ENCOUNTER — Ambulatory Visit
Admission: RE | Admit: 2018-09-05 | Discharge: 2018-09-05 | Disposition: A | Discharge status: Home / Self Care | Payer: BC Managed Care – PPO | Source: Ambulatory Visit | Attending: Family Medicine | Admitting: Family Medicine

## 2018-09-05 DIAGNOSIS — R079 Chest pain, unspecified: Secondary | ICD-10-CM | POA: Diagnosis not present

## 2018-09-27 DIAGNOSIS — K219 Gastro-esophageal reflux disease without esophagitis: Secondary | ICD-10-CM | POA: Diagnosis not present

## 2018-09-27 DIAGNOSIS — R070 Pain in throat: Secondary | ICD-10-CM | POA: Diagnosis not present

## 2018-10-13 ENCOUNTER — Encounter: Payer: Self-pay | Admitting: Cardiology

## 2018-10-17 ENCOUNTER — Encounter: Payer: Self-pay | Admitting: Cardiology

## 2018-10-17 ENCOUNTER — Ambulatory Visit: Payer: BC Managed Care – PPO | Admitting: Cardiology

## 2018-10-17 ENCOUNTER — Other Ambulatory Visit: Payer: Self-pay

## 2018-10-17 VITALS — BP 147/105 | HR 89 | Ht 71.0 in | Wt 200.0 lb

## 2018-10-17 DIAGNOSIS — E785 Hyperlipidemia, unspecified: Secondary | ICD-10-CM

## 2018-10-17 DIAGNOSIS — Z8249 Family history of ischemic heart disease and other diseases of the circulatory system: Secondary | ICD-10-CM | POA: Diagnosis not present

## 2018-10-17 DIAGNOSIS — R0989 Other specified symptoms and signs involving the circulatory and respiratory systems: Secondary | ICD-10-CM

## 2018-10-17 DIAGNOSIS — R0789 Other chest pain: Secondary | ICD-10-CM | POA: Diagnosis not present

## 2018-10-17 MED ORDER — AMLODIPINE BESY-BENAZEPRIL HCL 5-10 MG PO CAPS: 1.0000 | ORAL_CAPSULE | Freq: Every day | ORAL | 1 refills | Status: DC

## 2018-10-17 NOTE — Progress Notes (Signed)
Primary Physician:  Lawerance Cruel, MD   Patient ID: Steve Bennett, male    DOB: 01/22/1976, 43 y.o.   MRN: [id number]  Subjective:    Chief Complaint  Patient presents with  . Chest Pain  . New Patient (Initial Visit)    HPI: Steve Bennett  is a 43 y.o. male  with GERD, family history of early CAD, referred to [place] by Dr. Benjamine Mola for evaluation of chest pain and for second opinion.   Chest pain generally occurs after first getting up in the morning. Generally 3-5/10. Symptoms first started in April. Pain is now occurring more randomly. He has had a few episodes while exercising, but has also done some intense exercises and has been able to tolerate this well. Denies any dyspnea on exertion or palpitations. No leg edema.  He denies any history of hyperlipidemia or hypertension. He does report that occasionally his BP has been in the 756-433 systolic and upper 29'J diastolic.   Family history of heart disease in his father with MI in his 90's.   No history of tobacco use.   History reviewed. No pertinent past medical history.  Past Surgical History:  Procedure Laterality Date  . MICRODISCECTOMY LUMBAR  01/2010    Social History   Socioeconomic History  . Marital status: Single    Spouse name: Not on file  . Number of children: 0  . Years of education: Not on file  . Highest education level: Not on file  Occupational History  . Not on file  Social Needs  . Financial resource strain: Not on file  . Food insecurity    Worry: Not on file    Inability: Not on file  . Transportation needs    Medical: Not on file    Non-medical: Not on file  Tobacco Use  . Smoking status: Former Research scientist (life sciences)  . Smokeless tobacco: Never Used  . Tobacco comment: smoked for a few years in college   Substance and Sexual Activity  . Alcohol use: Yes    Alcohol/week: 0.0 standard drinks    Comment: social use  . Drug use: No  . Sexual activity: Not Currently  Lifestyle  .  Physical activity    Days per week: Not on file    Minutes per session: Not on file  . Stress: Not on file  Relationships  . Social Herbalist on phone: Not on file    Gets together: Not on file    Attends religious service: Not on file    Active member of club or organization: Not on file    Attends meetings of clubs or organizations: Not on file    Relationship status: Not on file  . Intimate partner violence    Fear of current or ex partner: Not on file    Emotionally abused: Not on file    Physically abused: Not on file    Forced sexual activity: Not on file  Other Topics Concern  . Not on file  Social History Narrative  . Not on file    Review of Systems  Constitution: Negative for decreased appetite, malaise/fatigue, weight gain and weight loss.  Eyes: Negative for visual disturbance.  Cardiovascular: Positive for chest pain. Negative for claudication, dyspnea on exertion, leg swelling, orthopnea, palpitations and syncope.  Respiratory: Negative for hemoptysis and wheezing.   Endocrine: Negative for cold intolerance and heat intolerance.  Hematologic/Lymphatic: Does not bruise/bleed easily.  Skin: Negative for nail changes.  Musculoskeletal:  Negative for muscle weakness and myalgias.  Gastrointestinal: Positive for heartburn. Negative for abdominal pain, change in bowel habit, nausea and vomiting.  Neurological: Negative for difficulty with concentration, dizziness, focal weakness and headaches.  Psychiatric/Behavioral: Negative for altered mental status and suicidal ideas.  All other systems reviewed and are negative.     Objective:  Blood pressure (!) 147/105, pulse 89, height 5\' 11"  (1.803 m), weight 200 lb (90.7 kg), SpO2 98 %. Body mass index is 27.89 kg/m.    Physical Exam  Constitutional: He is oriented to person, place, and time. Vital signs are normal. He appears well-developed and well-nourished.  HENT:  Head: Normocephalic and atraumatic.   Neck: Normal range of motion.  Cardiovascular: Normal rate, regular rhythm, normal heart sounds and intact distal pulses.  Pulmonary/Chest: Effort normal and breath sounds normal. No accessory muscle usage. No respiratory distress.  Abdominal: Soft. Bowel sounds are normal.  Musculoskeletal: Normal range of motion.  Neurological: He is alert and oriented to person, place, and time.  Skin: Skin is warm and dry.  Vitals reviewed.  Radiology: No results found.  Laboratory examination:    CMP Latest Ref Rng & Units 08/31/2016  Glucose 65 - 99 mg/dL 104(H)  BUN 6 - 20 mg/dL 14  Creatinine 0.61 - 1.24 mg/dL 0.99  Sodium 135 - 145 mmol/L 140  Potassium 3.5 - 5.1 mmol/L 3.6  Chloride 101 - 111 mmol/L 103  CO2 22 - 32 mmol/L 27  Calcium 8.9 - 10.3 mg/dL 9.4   CBC Latest Ref Rng & Units 08/31/2016 05/13/2015  WBC 4.0 - 10.5 K/uL 6.0 5.8  Hemoglobin 13.0 - 17.0 g/dL 15.0 15.7  Hematocrit 39.0 - 52.0 % 41.8 46.2  Platelets 150 - 400 K/uL 291 358.0   Lipid Panel  No results found for: CHOL, TRIG, HDL, CHOLHDL, VLDL, LDLCALC, LDLDIRECT HEMOGLOBIN A1C No results found for: HGBA1C, MPG TSH No results for input(s): TSH in the last 8760 hours.  PRN Meds:. Medications Discontinued During This Encounter  Medication Reason  . cetirizine (ZYRTEC) 10 MG tablet    Current Meds  Medication Sig  . famotidine (PEPCID) 20 MG tablet Take 20 mg by mouth as needed.  Marland Kitchen omeprazole (PRILOSEC) 20 MG capsule Take 20 mg by mouth daily.     Cardiac Studies:   Lexiscan nuclear stress test 07/13/2018: The left ventricular ejection fraction is normal (55-65%).  Nuclear stress EF: 55%.  No T wave inversion was noted during stress.  There was no ST segment deviation noted during stress.  This is a low risk study.   No reversible ischemia. LVEF 55% with normal wall motion. This is a low risk study.  Calcium score CT 08/23/2018: Coronary calcium score 0  Assessment:     ICD-10-CM   1. Atypical  chest pain  R07.89 EKG 12-Lead    PCV ECHOCARDIOGRAM COMPLETE  2. Labile hypertension  R09.89 PCV ECHOCARDIOGRAM COMPLETE    Basic metabolic panel    Basic metabolic panel  3. Family history of early CAD  Z34.49   26. Mild hyperlipidemia  E78.5     EKG 10/17/2018: Normal sinus rhythm at 77 bpm, normal axis, nonspecific T wave inversion of lead 3. No evidence of ischemia   Recommendations:   Patient has continued to have atypical chest pain that is occurring several times a day. He was previously evaluated by Dr. Tamala Julian, had negative lexiscan nuclear stress test and Calcium score of 0. Given these findings, suspicion of CAD is low. He is  questioning if he could potentially have coronary vasospasm. Although this is possible, I suspect that his symptoms are potentially related to hypertension or possibly even GERD. His blood pressure is elevated today and I suspect that he has had labile hypertension given his occasional elevations in blood pressure. I will start him on benazepril-amlodipine that will both help with hypertension and potentially vasospasms. I will obtain echocardiogram to exclude any structural abnormalities. If he continues to have symptoms despite this, may consider GI evaluation. I have asked him to take omeprazole on an empty stomach.   I have reviewed his labs, he does have elevated LDL. Although his ratio is good, given his family history of CAD and elevated LDL, he will likely benefit from statin therapy. He feels that he can make diet changes to help with this and prefers to try this first. He will need repeat lipids in a few months after doing this. I will see him back in 6 weeks for follow up on hypertension and chest pain.    *I have discussed this case with Dr. Einar Gip and he personally examined the patient and participated in formulating the plan.*   [name], MSN, APRN, FNP-C [facility]. [place] Office: [phone] Fax: [phone]

## 2018-10-18 ENCOUNTER — Encounter: Payer: Self-pay | Admitting: Cardiology

## 2018-10-31 DIAGNOSIS — R0989 Other specified symptoms and signs involving the circulatory and respiratory systems: Secondary | ICD-10-CM | POA: Diagnosis not present

## 2018-11-01 LAB — BASIC METABOLIC PANEL
BUN/Creatinine Ratio: 18 (ref 9–20)
BUN: 16 mg/dL (ref 6–24)
CO2: 26 mmol/L (ref 20–29)
Calcium: 10.1 mg/dL (ref 8.7–10.2)
Chloride: 101 mmol/L (ref 96–106)
Creatinine, Ser: 0.87 mg/dL (ref 0.76–1.27)
GFR calc Af Amer: 122 mL/min/{1.73_m2} (ref >59)
GFR calc non Af Amer: 106 mL/min/{1.73_m2} (ref >59)
Glucose: 95 mg/dL (ref 65–99)
Potassium: 4.8 mmol/L (ref 3.5–5.2)
Sodium: 141 mmol/L (ref 134–144)

## 2018-11-02 NOTE — Progress Notes (Signed)
Pt is aware.  

## 2018-11-03 ENCOUNTER — Other Ambulatory Visit: Payer: Self-pay

## 2018-11-03 ENCOUNTER — Ambulatory Visit (INDEPENDENT_AMBULATORY_CARE_PROVIDER_SITE_OTHER): Payer: BC Managed Care – PPO

## 2018-11-03 DIAGNOSIS — R0789 Other chest pain: Secondary | ICD-10-CM

## 2018-11-03 DIAGNOSIS — R0989 Other specified symptoms and signs involving the circulatory and respiratory systems: Secondary | ICD-10-CM

## 2018-11-14 ENCOUNTER — Other Ambulatory Visit: Payer: Self-pay

## 2018-11-14 ENCOUNTER — Encounter: Payer: Self-pay | Admitting: Cardiology

## 2018-11-14 ENCOUNTER — Ambulatory Visit (INDEPENDENT_AMBULATORY_CARE_PROVIDER_SITE_OTHER): Payer: BC Managed Care – PPO | Admitting: Cardiology

## 2018-11-14 VITALS — BP 130/94 | HR 65 | Ht 71.0 in | Wt 207.0 lb

## 2018-11-14 DIAGNOSIS — Z8249 Family history of ischemic heart disease and other diseases of the circulatory system: Secondary | ICD-10-CM | POA: Diagnosis not present

## 2018-11-14 DIAGNOSIS — E785 Hyperlipidemia, unspecified: Secondary | ICD-10-CM | POA: Diagnosis not present

## 2018-11-14 DIAGNOSIS — R0789 Other chest pain: Secondary | ICD-10-CM | POA: Diagnosis not present

## 2018-11-14 DIAGNOSIS — R0989 Other specified symptoms and signs involving the circulatory and respiratory systems: Secondary | ICD-10-CM | POA: Diagnosis not present

## 2018-11-14 MED ORDER — AMLODIPINE BESYLATE-VALSARTAN 5-160 MG PO TABS: 1.0000 | ORAL_TABLET | Freq: Every day | ORAL | 1 refills | Status: DC

## 2018-11-14 NOTE — Progress Notes (Signed)
Primary Physician:  Lawerance Cruel, MD   Patient ID: Steve Bennett, male    DOB: 06/26/1975, 43 y.o.   MRN: [id number]  Subjective:    Chief Complaint  Patient presents with  . Hypertension    4 WEEK F/U tests  . Chest Pain    HPI: Steve Bennett  is a 43 y.o. male  with GERD, family history of early CAD, recently evaluated by [place] for chest pain.  Patient has continued to have chest pain almost daily at rest, but is now having episodes of chest discomfort with exercise.  No pain radiation or associated shortness of breath.  He has noticed his chest discomfort particularly with going on hikes and resolves with resting after 1 to 2 minutes.  His episodes are generally very brief.  Denies any dyspnea on exertion or palpitations. No leg edema.  He was felt to have labile hypertension, was started on amlodipine benazepril at his last office visit, he is tolerating well but has noticed dry hacking cough.  He denies any history of hyperlipidemia.  States recent lipids performed at his job were within normal limits.  Family history of heart disease in his father with MI in his 27's.   No history of tobacco use.   Past Medical History:  Diagnosis Date  . Hypertension     Past Surgical History:  Procedure Laterality Date  . MICRODISCECTOMY LUMBAR  01/2010    Social History   Socioeconomic History  . Marital status: Single    Spouse name: Not on file  . Number of children: 0  . Years of education: Not on file  . Highest education level: Not on file  Occupational History  . Not on file  Social Needs  . Financial resource strain: Not on file  . Food insecurity    Worry: Not on file    Inability: Not on file  . Transportation needs    Medical: Not on file    Non-medical: Not on file  Tobacco Use  . Smoking status: Former Smoker    Types: Cigarettes  . Smokeless tobacco: Never Used  . Tobacco comment: smoked for a few years in college   Substance and  Sexual Activity  . Alcohol use: Yes    Alcohol/week: 0.0 standard drinks    Comment: social use  . Drug use: No  . Sexual activity: Not Currently  Lifestyle  . Physical activity    Days per week: Not on file    Minutes per session: Not on file  . Stress: Not on file  Relationships  . Social Herbalist on phone: Not on file    Gets together: Not on file    Attends religious service: Not on file    Active member of club or organization: Not on file    Attends meetings of clubs or organizations: Not on file    Relationship status: Not on file  . Intimate partner violence    Fear of current or ex partner: Not on file    Emotionally abused: Not on file    Physically abused: Not on file    Forced sexual activity: Not on file  Other Topics Concern  . Not on file  Social History Narrative  . Not on file    Review of Systems  Constitution: Negative for decreased appetite, malaise/fatigue, weight gain and weight loss.  Eyes: Negative for visual disturbance.  Cardiovascular: Positive for chest pain. Negative for claudication, dyspnea  on exertion, leg swelling, orthopnea, palpitations and syncope.  Respiratory: Negative for hemoptysis and wheezing.   Endocrine: Negative for cold intolerance and heat intolerance.  Hematologic/Lymphatic: Does not bruise/bleed easily.  Skin: Negative for nail changes.  Musculoskeletal: Negative for muscle weakness and myalgias.  Gastrointestinal: Positive for heartburn. Negative for abdominal pain, change in bowel habit, nausea and vomiting.  Neurological: Negative for difficulty with concentration, dizziness, focal weakness and headaches.  Psychiatric/Behavioral: Negative for altered mental status and suicidal ideas.  All other systems reviewed and are negative.     Objective:  Blood pressure (!) 130/94, pulse 65, height 5\' 11"  (1.803 m), weight 207 lb (93.9 kg), SpO2 99 %. Body mass index is 28.87 kg/m.    Physical Exam  Constitutional:  He is oriented to person, place, and time. Vital signs are normal. He appears well-developed and well-nourished.  HENT:  Head: Normocephalic and atraumatic.  Neck: Normal range of motion.  Cardiovascular: Normal rate, regular rhythm, normal heart sounds and intact distal pulses.  Pulmonary/Chest: Effort normal and breath sounds normal. No accessory muscle usage. No respiratory distress.  Abdominal: Soft. Bowel sounds are normal.  Musculoskeletal: Normal range of motion.  Neurological: He is alert and oriented to person, place, and time.  Skin: Skin is warm and dry.  Vitals reviewed.  Radiology: No results found.  Laboratory examination:   11/09/2018: Total cholesterol 201, HDL 79, triglycerides and LDL not performed.  TC/HDL ratio 2.5.  CMP Latest Ref Rng & Units 10/31/2018 08/31/2016  Glucose 65 - 99 mg/dL 95 104(H)  BUN 6 - 24 mg/dL 16 14  Creatinine 0.76 - 1.27 mg/dL 0.87 0.99  Sodium 134 - 144 mmol/L 141 140  Potassium 3.5 - 5.2 mmol/L 4.8 3.6  Chloride 96 - 106 mmol/L 101 103  CO2 20 - 29 mmol/L 26 27  Calcium 8.7 - 10.2 mg/dL 10.1 9.4   CBC Latest Ref Rng & Units 08/31/2016 05/13/2015  WBC 4.0 - 10.5 K/uL 6.0 5.8  Hemoglobin 13.0 - 17.0 g/dL 15.0 15.7  Hematocrit 39.0 - 52.0 % 41.8 46.2  Platelets 150 - 400 K/uL 291 358.0   Lipid Panel  No results found for: CHOL, TRIG, HDL, CHOLHDL, VLDL, LDLCALC, LDLDIRECT HEMOGLOBIN A1C No results found for: HGBA1C, MPG TSH No results for input(s): TSH in the last 8760 hours.  PRN Meds:. Medications Discontinued During This Encounter  Medication Reason  . amLODipine-benazepril (LOTREL) 5-10 MG capsule Discontinued by provider   Current Meds  Medication Sig  . famotidine (PEPCID) 20 MG tablet Take 20 mg by mouth as needed.  Marland Kitchen omeprazole (PRILOSEC) 20 MG capsule Take 20 mg by mouth daily.   . [DISCONTINUED] amLODipine-benazepril (LOTREL) 5-10 MG capsule Take 1 capsule by mouth daily.    Cardiac Studies:   Echocardiogram  11/03/2018: Left ventricle cavity is normal in size. Normal left ventricular wall thickness. Normal LV systolic function with EF 55%. Normal global wall motion. Normal diastolic filling pattern.  Mild (Grade I) mitral regurgitation. Mild tricuspid regurgitation. Estimated pulmonary artery systolic pressure is 27 mmHg.  Lexiscan nuclear stress test 07/13/2018: The left ventricular ejection fraction is normal (55-65%).  Nuclear stress EF: 55%.  No T wave inversion was noted during stress.  There was no ST segment deviation noted during stress.  This is a low risk study.   No reversible ischemia. LVEF 55% with normal wall motion. This is a low risk study.  Calcium score CT 08/23/2018: Coronary calcium score 0  Assessment:     ICD-10-CM  1. Atypical chest pain  R07.89 Ambulatory referral to Gastroenterology    CT [place] ([facility])    CT CORONARY FRACTIONAL FLOW RESERVE FLUID ANALYSIS    CT CORONARY MORPH W/CTA COR W/SCORE W/CA W/CM &/OR WO/CM  2. Family history of early CAD  Z82.49   3. Labile hypertension  R09.89   4. Mild hyperlipidemia  E78.5     EKG 10/17/2018: Normal sinus rhythm at 77 bpm, normal axis, nonspecific T wave inversion of lead 3. No evidence of ischemia   Recommendations:   Patient continues to have episodes of atypical chest pain. He previously was noticing at rest; however, now notices this with hiking or other activities. No pain radiation or shortness of breath. Last 1-2 minutes and resolves with resting. He is very concerned that this is cardiac.  He has had negative cardiac work-up including nuclear stress testing, calcium score CT, and echocardiogram.  Extensively discussed his echocardiogram, which is essentially normal. He does have family history of early CAD in his father and given his continued chest pain, will plan for further evaluation. Discussed options of diagnostic cath vs. Coronary CTA as well as risk and benefits with both. He  prefers coronary CTA, and will arrange for this.  He has developed dry hacking cough with benazepril, will discontinue.  Will start amlodipine valsartan.  Kidney function has remained stable.  Will place referral to GI for evaluation of his atypical chest pain and possible etiology of GERD.  Patient is anxious and frustrated regarding his symptoms with no etiology has yet to be found.  He reports recent cholesterol panel performed at his job was stable and will bring records to [place].  I will see him back in 6 weeks to follow-up on her blood pressure and also his chest pain, encouraged him to contact me sooner if needed.   [name], MSN, APRN, FNP-C [facility]. [place] Office: [phone] Fax: [phone]

## 2018-11-28 ENCOUNTER — Ambulatory Visit (INDEPENDENT_AMBULATORY_CARE_PROVIDER_SITE_OTHER): Payer: BC Managed Care – PPO | Admitting: Physician Assistant

## 2018-11-28 ENCOUNTER — Other Ambulatory Visit: Payer: Self-pay

## 2018-11-28 ENCOUNTER — Encounter: Payer: Self-pay | Admitting: Physician Assistant

## 2018-11-28 VITALS — BP 116/70 | HR 68 | Temp 98.9°F | Ht 71.0 in | Wt 200.0 lb

## 2018-11-28 DIAGNOSIS — R0789 Other chest pain: Secondary | ICD-10-CM

## 2018-11-28 MED ORDER — OMEPRAZOLE 40 MG PO CPDR: 40.0000 mg | DELAYED_RELEASE_CAPSULE | Freq: Two times a day (BID) | ORAL | 0 refills | Status: DC

## 2018-11-28 MED ORDER — OMEPRAZOLE 20 MG PO CPDR: 20.0000 mg | DELAYED_RELEASE_CAPSULE | Freq: Two times a day (BID) | ORAL | 0 refills | Status: DC

## 2018-11-28 NOTE — Progress Notes (Signed)
Chief Complaint: Atypical chest pain  HPI:    Steve Bennett is a 43 year old male with a past medical history as listed below, known to Dr. Ardis Hughs for his reflux, who was referred to me by [name], * for a complaint of atypical chest pain.      05/13/2015 office visit with Nicoletta Ba for prolonged nausea and vomiting.  At that time continued on Zantac 150 milligrams twice daily and scheduled for EGD and colonoscopy.    06/18/2015 EGD for heartburn with LA grade B reflux esophagitis, small hiatal hernia, gastritis and otherwise normal.  Patient was started on omeprazole 40 mg daily.  Pathology showed chronic inactive gastritis.  Colonoscopy same day for change in bowel habits with 1 3 mm polyp in the cecum and otherwise normal.  Pathology showed tubular adenoma.  Recall colonoscopy recommended in 5 years.    11/14/2018 office visit with cardiology.  At that time discussed that he was having chest pain with exercise.  Previously had only been at rest.  He has had a normal cardiac work-up so far including nuclear stress testing, calcium score CT and echocardiogram.  Patient is also being scheduled for coronary CTA.  He was also referred to [place] for reflux which could be etiology of atypical chest pain.    Today, the patient describes that back on April 1 he had his first episode of a 9/10 pain which lasted 2 minutes before it went away.  This was so severe that he found himself unlocking the front door in the thought that he may have to call the EMS.  He laid down in his bed with his legs up and this pain disappeared.  He could then almost predict this midsternal/chest pain which would occur about a minute after getting out of bed in the morning, sometimes when he was just taking a shower.  It was never after eating.  Over the past few months it has now become after he exerts himself slightly but always in the morning or early afternoon.  Tells me he goes to the climbing gym every evening and  never has an episode in the evening or nighttime.  The frequency of this has definitely decreased over the past 3 weeks.  Denies relationship to eating.  Associated symptoms include some hoarseness or feeling of a frog in his throat.  He did see ENT who told him there was nothing there.  He wondered if it was stress/anxiety over COVID +/- having to wear a mask.  At some point patient's Omeprazole was changed from 40 mg daily to 20 mg in the morning and Pepcid was added 20 mg at night.  He is unsure if this is helping though does note a decrease in frequency of events as previously discussed.  Does tell me that he is now more likely to have an episode after the weekend and the only difference that he can think of is that he has a couple of beers on the weekends.  Patient is awaiting further testing from cardiology.    Denies fever, chills, weight loss, change in bowel habits or abdominal pain.  Past Medical History:  Diagnosis Date  . Hypertension     Past Surgical History:  Procedure Laterality Date  . MICRODISCECTOMY LUMBAR  01/2010    Current Outpatient Medications  Medication Sig Dispense Refill  . amLODipine-valsartan (EXFORGE) 5-160 MG tablet Take 1 tablet by mouth daily. 30 tablet 1  . famotidine (PEPCID) 20 MG tablet Take 20  mg by mouth as needed.    Marland Kitchen omeprazole (PRILOSEC) 20 MG capsule Take 20 mg by mouth daily.      No current facility-administered medications for this visit.     Allergies as of 11/28/2018  . (No Known Allergies)    Family History  Problem Relation Age of Onset  . Cancer Mother   . Diabetes Mother   . Heart disease Father   . Hyperlipidemia Father   . Cancer Sister   . Diabetes Maternal Grandmother   . Stroke Paternal Grandmother     Social History   Socioeconomic History  . Marital status: Single    Spouse name: Not on file  . Number of children: 0  . Years of education: Not on file  . Highest education level: Not on file  Occupational History   . Not on file  Social Needs  . Financial resource strain: Not on file  . Food insecurity    Worry: Not on file    Inability: Not on file  . Transportation needs    Medical: Not on file    Non-medical: Not on file  Tobacco Use  . Smoking status: Former Smoker    Types: Cigarettes  . Smokeless tobacco: Never Used  . Tobacco comment: smoked for a few years in college   Substance and Sexual Activity  . Alcohol use: Yes    Alcohol/week: 0.0 standard drinks    Comment: social use  . Drug use: No  . Sexual activity: Not Currently  Lifestyle  . Physical activity    Days per week: Not on file    Minutes per session: Not on file  . Stress: Not on file  Relationships  . Social Herbalist on phone: Not on file    Gets together: Not on file    Attends religious service: Not on file    Active member of club or organization: Not on file    Attends meetings of clubs or organizations: Not on file    Relationship status: Not on file  . Intimate partner violence    Fear of current or ex partner: Not on file    Emotionally abused: Not on file    Physically abused: Not on file    Forced sexual activity: Not on file  Other Topics Concern  . Not on file  Social History Narrative  . Not on file    Review of Systems:    Constitutional: No weight loss, fever or chills Skin: No rash Cardiovascular: No chest pain Respiratory: No SOB  Gastrointestinal: See HPI and otherwise negative Genitourinary: No dysuria  Neurological: No headache, dizziness or syncope Musculoskeletal: No new muscle or joint pain Hematologic: No bleeding  Psychiatric: No history of depression or anxiety   Physical Exam:  Vital signs: BP 116/70   Pulse 68   Temp 98.9 F (37.2 C) (Oral)   Ht 5\' 11"  (1.803 m)   Wt 200 lb (90.7 kg)   BMI 27.89 kg/m   Constitutional:   Pleasant Caucasian male appears to be in NAD, Well developed, Well nourished, alert and cooperative Head:  Normocephalic and  atraumatic. Eyes:   PEERL, EOMI. No icterus. Conjunctiva pink. Ears:  Normal auditory acuity. Neck:  Supple Throat: Oral cavity and pharynx without inflammation, swelling or lesion.  Respiratory: Respirations even and unlabored. Lungs clear to auscultation bilaterally.   No wheezes, crackles, or rhonchi.  Cardiovascular: Normal S1, S2. No MRG. Regular rate and rhythm. No peripheral edema,  cyanosis or pallor.  Gastrointestinal:  Soft, nondistended,mild ttp in RUQ. No rebound or guarding. Normal bowel sounds. No appreciable masses or hepatomegaly. Rectal:  Not performed.  Msk:  Symmetrical without gross deformities. Without edema, no deformity or joint abnormality.  Neurologic:  Alert and  oriented x4;  grossly normal neurologically.  Skin:   Dry and intact without significant lesions or rashes. Psychiatric: Demonstrates good judgement and reason without abnormal affect or behaviors.  MOST RECENT LABS:  CMP     Component Value Date/Time   NA 141 10/31/2018 1645   K 4.8 10/31/2018 1645   CL 101 10/31/2018 1645   CO2 26 10/31/2018 1645   GLUCOSE 95 10/31/2018 1645   GLUCOSE 104 (H) 08/31/2016 1728   BUN 16 10/31/2018 1645   CREATININE 0.87 10/31/2018 1645   CALCIUM 10.1 10/31/2018 1645   GFRNONAA 106 10/31/2018 1645   GFRAA 122 10/31/2018 1645    Assessment: 1.  Atypical chest pain: Negative cardiac work-up so far with echo, stress test and other, awaiting coronary CTA, known history of reflux, no relationship to eating, though has decreased in frequency recently and Omeprazole was changed to 20 mg in the morning and Pepcid was added at night; known history of gastritis, last EGD in 2017 continue to consider cardiac versus GI source  Plan: 1.  Discussed patient's symptoms with him in detail.  Either way he would be having atypical symptoms for gastroenterology source and/or cardiac source. 2.  At this time we are going to try max PPI therapy.  Increased his Omeprazole to 40 mg twice  daily.  Prescribed #30.  Discussed that he should do this for 2 weeks.  He can discontinue his Famotidine during this time.  He was instructed to keep a strict journal about symptoms and also eating habits over this time to see if there is a relationship. 3.  If the patient feels better after being on increased dose of PPI for the next couple of weeks then he should call our clinic and we could discuss further testing including an endoscopy to see what is going on.  If he feels no different then would recommend he complete his cardiac work-up.  If that is all negative then could still consider an EGD.  He verbalized understanding. 4.  Patient to follow in clinic as needed in the future, as directed by above  Ellouise Newer, PA-C [facility] 11/28/2018, 3:32 PM  Cc: [name], *

## 2018-11-28 NOTE — Patient Instructions (Addendum)
Increase Omeprazole to 40mg  twice daily for 2 weeks.   Stop Pepcid.   Call if better or cardiac work-up.   If you are age 44 or younger, your body mass index should be between 19-25. Your Body mass index is 27.89 kg/m. If this is out of the aformentioned range listed, please consider follow up with your Primary Care Provider.   Follow-up as needed.   Thank you for choosing me and [facility].  Dennison Bulla

## 2018-11-28 NOTE — Addendum Note (Signed)
Addended byDebbe Mounts on: 11/28/2018 04:10 PM   Modules accepted: Orders

## 2018-11-29 NOTE — Progress Notes (Signed)
I agree with the above note, plan 

## 2018-12-13 ENCOUNTER — Other Ambulatory Visit: Payer: Self-pay | Admitting: Cardiology

## 2018-12-22 ENCOUNTER — Other Ambulatory Visit: Payer: Self-pay | Admitting: Physician Assistant

## 2018-12-27 ENCOUNTER — Ambulatory Visit: Payer: BC Managed Care – PPO | Admitting: Cardiology

## 2018-12-27 NOTE — Telephone Encounter (Signed)
From pt

## 2018-12-27 NOTE — Progress Notes (Deleted)
Primary Physician:  Lawerance Cruel, MD   Patient ID: Steve Bennett, male    DOB: 11-10-1975, 43 y.o.   MRN: [id number]  Subjective:    No chief complaint on file.   HPI: Steve Bennett  is a 43 y.o. male  with GERD, family history of early CAD, recently evaluated by [place] for chest pain.  Patient has continued to have chest pain almost daily at rest, but is now having episodes of chest discomfort with exercise.  No pain radiation or associated shortness of breath.  He has noticed his chest discomfort particularly with going on hikes and resolves with resting after 1 to 2 minutes.  His episodes are generally very brief.  Denies any dyspnea on exertion or palpitations. No leg edema.  He was felt to have labile hypertension, was started on amlodipine benazepril at his last office visit, he is tolerating well but has noticed dry hacking cough.  He denies any history of hyperlipidemia.  States recent lipids performed at his job were within normal limits.  Family history of heart disease in his father with MI in his 30's.   No history of tobacco use.   Past Medical History:  Diagnosis Date  . Hypertension     Past Surgical History:  Procedure Laterality Date  . MICRODISCECTOMY LUMBAR  01/2010    Social History   Socioeconomic History  . Marital status: Single    Spouse name: Not on file  . Number of children: 0  . Years of education: Not on file  . Highest education level: Not on file  Occupational History  . Not on file  Social Needs  . Financial resource strain: Not on file  . Food insecurity    Worry: Not on file    Inability: Not on file  . Transportation needs    Medical: Not on file    Non-medical: Not on file  Tobacco Use  . Smoking status: Former Smoker    Types: Cigarettes  . Smokeless tobacco: Never Used  . Tobacco comment: smoked for a few years in college   Substance and Sexual Activity  . Alcohol use: Yes    Alcohol/week: 0.0  standard drinks    Comment: social use  . Drug use: No  . Sexual activity: Not Currently  Lifestyle  . Physical activity    Days per week: Not on file    Minutes per session: Not on file  . Stress: Not on file  Relationships  . Social Herbalist on phone: Not on file    Gets together: Not on file    Attends religious service: Not on file    Active member of club or organization: Not on file    Attends meetings of clubs or organizations: Not on file    Relationship status: Not on file  . Intimate partner violence    Fear of current or ex partner: Not on file    Emotionally abused: Not on file    Physically abused: Not on file    Forced sexual activity: Not on file  Other Topics Concern  . Not on file  Social History Narrative  . Not on file    Review of Systems  Constitution: Negative for decreased appetite, malaise/fatigue, weight gain and weight loss.  Eyes: Negative for visual disturbance.  Cardiovascular: Positive for chest pain. Negative for claudication, dyspnea on exertion, leg swelling, orthopnea, palpitations and syncope.  Respiratory: Negative for hemoptysis and wheezing.  Endocrine: Negative for cold intolerance and heat intolerance.  Hematologic/Lymphatic: Does not bruise/bleed easily.  Skin: Negative for nail changes.  Musculoskeletal: Negative for muscle weakness and myalgias.  Gastrointestinal: Positive for heartburn. Negative for abdominal pain, change in bowel habit, nausea and vomiting.  Neurological: Negative for difficulty with concentration, dizziness, focal weakness and headaches.  Psychiatric/Behavioral: Negative for altered mental status and suicidal ideas.  All other systems reviewed and are negative.     Objective:  There were no vitals taken for this visit. There is no height or weight on file to calculate BMI.    Physical Exam  Constitutional: He is oriented to person, place, and time. Vital signs are normal. He appears  well-developed and well-nourished.  HENT:  Head: Normocephalic and atraumatic.  Neck: Normal range of motion.  Cardiovascular: Normal rate, regular rhythm, normal heart sounds and intact distal pulses.  Pulmonary/Chest: Effort normal and breath sounds normal. No accessory muscle usage. No respiratory distress.  Abdominal: Soft. Bowel sounds are normal.  Musculoskeletal: Normal range of motion.  Neurological: He is alert and oriented to person, place, and time.  Skin: Skin is warm and dry.  Vitals reviewed.  Radiology: No results found.  Laboratory examination:   11/09/2018: Total cholesterol 201, HDL 79, triglycerides and LDL not performed.  TC/HDL ratio 2.5.  CMP Latest Ref Rng & Units 10/31/2018 08/31/2016  Glucose 65 - 99 mg/dL 95 104(H)  BUN 6 - 24 mg/dL 16 14  Creatinine 0.76 - 1.27 mg/dL 0.87 0.99  Sodium 134 - 144 mmol/L 141 140  Potassium 3.5 - 5.2 mmol/L 4.8 3.6  Chloride 96 - 106 mmol/L 101 103  CO2 20 - 29 mmol/L 26 27  Calcium 8.7 - 10.2 mg/dL 10.1 9.4   CBC Latest Ref Rng & Units 08/31/2016 05/13/2015  WBC 4.0 - 10.5 K/uL 6.0 5.8  Hemoglobin 13.0 - 17.0 g/dL 15.0 15.7  Hematocrit 39.0 - 52.0 % 41.8 46.2  Platelets 150 - 400 K/uL 291 358.0   Lipid Panel  No results found for: CHOL, TRIG, HDL, CHOLHDL, VLDL, LDLCALC, LDLDIRECT HEMOGLOBIN A1C No results found for: HGBA1C, MPG TSH No results for input(s): TSH in the last 8760 hours.  PRN Meds:. There are no discontinued medications. No outpatient medications have been marked as taking for the 12/27/18 encounter (Appointment) with [name], NP.    Cardiac Studies:   Echocardiogram 11/03/2018: Left ventricle cavity is normal in size. Normal left ventricular wall thickness. Normal LV systolic function with EF 55%. Normal global wall motion. Normal diastolic filling pattern.  Mild (Grade I) mitral regurgitation. Mild tricuspid regurgitation. Estimated pulmonary artery systolic pressure is 27 mmHg.   Lexiscan nuclear stress test 07/13/2018: The left ventricular ejection fraction is normal (55-65%).  Nuclear stress EF: 55%.  No T wave inversion was noted during stress.  There was no ST segment deviation noted during stress.  This is a low risk study.   No reversible ischemia. LVEF 55% with normal wall motion. This is a low risk study.  Calcium score CT 08/23/2018: Coronary calcium score 0  Assessment:   No diagnosis found.  EKG 10/17/2018: Normal sinus rhythm at 77 bpm, normal axis, nonspecific T wave inversion of lead 3. No evidence of ischemia   Recommendations:   Patient continues to have episodes of atypical chest pain. He previously was noticing at rest; however, now notices this with hiking or other activities. No pain radiation or shortness of breath. Last 1-2 minutes and resolves with resting. He is  very concerned that this is cardiac.  He has had negative cardiac work-up including nuclear stress testing, calcium score CT, and echocardiogram.  Extensively discussed his echocardiogram, which is essentially normal. He does have family history of early CAD in his father and given his continued chest pain, will plan for further evaluation. Discussed options of diagnostic cath vs. Coronary CTA as well as risk and benefits with both. He prefers coronary CTA, and will arrange for this.  He has developed dry hacking cough with benazepril, will discontinue.  Will start amlodipine valsartan.  Kidney function has remained stable.  Will place referral to GI for evaluation of his atypical chest pain and possible etiology of GERD.  Patient is anxious and frustrated regarding his symptoms with no etiology has yet to be found.  He reports recent cholesterol panel performed at his job was stable and will bring records to [place].  I will see him back in 6 weeks to follow-up on her blood pressure and also his chest pain, encouraged him to contact me sooner if needed.   [name], MSN,  APRN, FNP-C [facility]. [place] Office: [phone] Fax: [phone]

## 2019-01-03 ENCOUNTER — Other Ambulatory Visit: Payer: Self-pay

## 2019-01-03 ENCOUNTER — Ambulatory Visit: Payer: BC Managed Care – PPO | Admitting: Cardiology

## 2019-01-03 ENCOUNTER — Encounter: Payer: Self-pay | Admitting: Cardiology

## 2019-01-03 VITALS — BP 142/89 | HR 71 | Temp 95.7°F | Ht 71.0 in | Wt 211.8 lb

## 2019-01-03 DIAGNOSIS — R079 Chest pain, unspecified: Secondary | ICD-10-CM

## 2019-01-03 DIAGNOSIS — I10 Essential (primary) hypertension: Secondary | ICD-10-CM | POA: Diagnosis not present

## 2019-01-03 DIAGNOSIS — K219 Gastro-esophageal reflux disease without esophagitis: Secondary | ICD-10-CM

## 2019-01-03 NOTE — Progress Notes (Signed)
Primary Physician:  Lawerance Cruel, MD   Patient ID: Steve Bennett, male    DOB: 05/23/1975, 43 y.o.   MRN: [id number]  Subjective:    Chief Complaint  Patient presents with  . Hypertension    pt c/o chest pain on exertion  . Chest Pain    HPI: Steve Bennett  is a 43 y.o. male  with GERD, family history of early CAD, followed by [place] for chest pain.   Patient initially began having chest pain in April that was mostly at rest, but now he is having chest pain almost daily associated with exertion.  He has undergone cardiac evaluation with calcium score CT and Lexiscan nuclear stress testing that have been unyielding.  No pain radiation or associated shortness of breath.  He has noticed his chest discomfort particularly with going on hikes and resolves with resting after 1 to 2 minutes.  His episodes are generally very brief.  Denies any dyspnea on exertion or palpitations. No leg edema.  Due to continued symptoms, he is scheduled for coronary CTA next week.  During the interim, he has been evaluated by GI who also felt that his symptoms may be related to GERD.  He has been started on Prilosec 40 mg twice daily.  Depending upon cardiac evaluation they will proceed with further GI evaluation.  He has not noticed any improvement in his chest pain since being on twice a day dosing of omeprazole. Due to hypertension, he was started on amlodipine valsartan at his last visit.  He had dry hacking cough with benazepril.  He continues to notice a hacking cough, but mostly in the morning.  He has not been monitoring his blood pressure regularly.  He denies any history of hyperlipidemia.  States recent lipids performed at his job were within normal limits.  Family history of heart disease in his father with MI in his 53's.   No history of tobacco use.   Past Medical History:  Diagnosis Date  . Hypertension     Past Surgical History:  Procedure Laterality Date  .  MICRODISCECTOMY LUMBAR  01/2010    Social History   Socioeconomic History  . Marital status: Single    Spouse name: Not on file  . Number of children: 0  . Years of education: Not on file  . Highest education level: Not on file  Occupational History  . Not on file  Social Needs  . Financial resource strain: Not on file  . Food insecurity    Worry: Not on file    Inability: Not on file  . Transportation needs    Medical: Not on file    Non-medical: Not on file  Tobacco Use  . Smoking status: Former Smoker    Types: Cigarettes  . Smokeless tobacco: Never Used  . Tobacco comment: smoked for a few years in college   Substance and Sexual Activity  . Alcohol use: Yes    Alcohol/week: 0.0 standard drinks    Comment: social use  . Drug use: No  . Sexual activity: Not Currently  Lifestyle  . Physical activity    Days per week: Not on file    Minutes per session: Not on file  . Stress: Not on file  Relationships  . Social Herbalist on phone: Not on file    Gets together: Not on file    Attends religious service: Not on file    Active member of club or  organization: Not on file    Attends meetings of clubs or organizations: Not on file    Relationship status: Not on file  . Intimate partner violence    Fear of current or ex partner: Not on file    Emotionally abused: Not on file    Physically abused: Not on file    Forced sexual activity: Not on file  Other Topics Concern  . Not on file  Social History Narrative  . Not on file    Review of Systems  Constitution: Negative for decreased appetite, malaise/fatigue, weight gain and weight loss.  Eyes: Negative for visual disturbance.  Cardiovascular: Positive for chest pain. Negative for claudication, dyspnea on exertion, leg swelling, orthopnea, palpitations and syncope.  Respiratory: Negative for hemoptysis and wheezing.   Endocrine: Negative for cold intolerance and heat intolerance.  Hematologic/Lymphatic:  Does not bruise/bleed easily.  Skin: Negative for nail changes.  Musculoskeletal: Negative for muscle weakness and myalgias.  Gastrointestinal: Positive for heartburn. Negative for abdominal pain, change in bowel habit, nausea and vomiting.  Neurological: Negative for difficulty with concentration, dizziness, focal weakness and headaches.  Psychiatric/Behavioral: Negative for altered mental status and suicidal ideas.  All other systems reviewed and are negative.     Objective:  Blood pressure (!) 142/89, pulse 71, temperature (!) 95.7 F (35.4 C), height 5\' 11"  (1.803 m), weight 211 lb 12.8 oz (96.1 kg), SpO2 97 %. Body mass index is 29.54 kg/m.    Physical Exam  Constitutional: He is oriented to person, place, and time. Vital signs are normal. He appears well-developed and well-nourished.  HENT:  Head: Normocephalic and atraumatic.  Neck: Normal range of motion.  Cardiovascular: Normal rate, regular rhythm, normal heart sounds and intact distal pulses.  Pulmonary/Chest: Effort normal and breath sounds normal. No accessory muscle usage. No respiratory distress.  Abdominal: Soft. Bowel sounds are normal.  Musculoskeletal: Normal range of motion.  Neurological: He is alert and oriented to person, place, and time.  Skin: Skin is warm and dry.  Vitals reviewed.  Radiology: No results found.  Laboratory examination:   11/09/2018: Total cholesterol 201, HDL 79, triglycerides and LDL not performed.  TC/HDL ratio 2.5.  CMP Latest Ref Rng & Units 10/31/2018 08/31/2016  Glucose 65 - 99 mg/dL 95 104(H)  BUN 6 - 24 mg/dL 16 14  Creatinine 0.76 - 1.27 mg/dL 0.87 0.99  Sodium 134 - 144 mmol/L 141 140  Potassium 3.5 - 5.2 mmol/L 4.8 3.6  Chloride 96 - 106 mmol/L 101 103  CO2 20 - 29 mmol/L 26 27  Calcium 8.7 - 10.2 mg/dL 10.1 9.4   CBC Latest Ref Rng & Units 08/31/2016 05/13/2015  WBC 4.0 - 10.5 K/uL 6.0 5.8  Hemoglobin 13.0 - 17.0 g/dL 15.0 15.7  Hematocrit 39.0 - 52.0 % 41.8 46.2   Platelets 150 - 400 K/uL 291 358.0   Lipid Panel  No results found for: CHOL, TRIG, HDL, CHOLHDL, VLDL, LDLCALC, LDLDIRECT HEMOGLOBIN A1C No results found for: HGBA1C, MPG TSH No results for input(s): TSH in the last 8760 hours.  PRN Meds:. Medications Discontinued During This Encounter  Medication Reason  . famotidine (PEPCID) 20 MG tablet Error   Current Meds  Medication Sig  . amLODipine-valsartan (EXFORGE) 5-160 MG tablet Take 1 tablet by mouth daily.  Marland Kitchen omeprazole (PRILOSEC) 40 MG capsule TAKE 1 CAPSULE BY MOUTH TWICE A DAY (Patient taking differently: Take 40 mg by mouth 2 (two) times daily. )    Cardiac Studies:   Echocardiogram  11/03/2018: Left ventricle cavity is normal in size. Normal left ventricular wall thickness. Normal LV systolic function with EF 55%. Normal global wall motion. Normal diastolic filling pattern.  Mild (Grade I) mitral regurgitation. Mild tricuspid regurgitation. Estimated pulmonary artery systolic pressure is 27 mmHg.  Lexiscan nuclear stress test 07/13/2018: The left ventricular ejection fraction is normal (55-65%).  Nuclear stress EF: 55%.  No T wave inversion was noted during stress.  There was no ST segment deviation noted during stress.  This is a low risk study.   No reversible ischemia. LVEF 55% with normal wall motion. This is a low risk study.  Calcium score CT 08/23/2018: Coronary calcium score 0  Assessment:     ICD-10-CM   1. Exertional chest pain  R07.9 EKG 12-Lead  2. Essential hypertension  I10   3. Gastroesophageal reflux disease without esophagitis  K21.9    EKG 01/03/2019: Normal sinus rhythm at 68 bpm, normal axis, no evidence of ischemia.   Recommendations:   Patient continues to have chest pain that is now exertional.  He has family history of early CAD and is also developed hypertension.  Although previous cardiac work-up has been unyielding, in view of his family history and exertional chest pain, he will be  further evaluated with coronary CTA for definitive diagnosis.  This is scheduled for next week.  His blood pressure continues to be elevated with amlodipine and valsartan.  I will add metoprolol tartrate 25 mg twice daily both for exertional chest pain and his blood pressure.  If cardiac work-up is unyielding, would recommend him pursuing further evaluation for GI etiology.  Depending upon coronary CTA results, will decide if he needs statin therapy.  We will see him back in approximately 4 weeks after CTA for follow-up.   [name], MSN, APRN, FNP-C [facility]. [place] Office: [phone] Fax: [phone]

## 2019-01-04 ENCOUNTER — Encounter: Payer: Self-pay | Admitting: Cardiology

## 2019-01-04 MED ORDER — METOPROLOL TARTRATE 25 MG PO TABS: 25.0000 mg | ORAL_TABLET | Freq: Two times a day (BID) | ORAL | 1 refills | Status: DC

## 2019-01-08 ENCOUNTER — Other Ambulatory Visit: Payer: Self-pay | Admitting: Cardiology

## 2019-01-09 ENCOUNTER — Telehealth (HOSPITAL_COMMUNITY): Payer: Self-pay | Admitting: Emergency Medicine

## 2019-01-09 NOTE — Telephone Encounter (Signed)
Reaching out to patient to offer assistance regarding upcoming cardiac imaging study; pt verbalizes understanding of appt date/time, parking situation and where to check in, pre-test NPO status and medications ordered, and verified current allergies; name and call back number provided for further questions should they arise Brenyn Petrey RN Navigator Cardiac Imaging [facility] [phone] office [phone] cell 

## 2019-01-10 ENCOUNTER — Other Ambulatory Visit: Payer: Self-pay

## 2019-01-10 ENCOUNTER — Ambulatory Visit (HOSPITAL_COMMUNITY)
Admission: RE | Admit: 2019-01-10 | Discharge: 2019-01-10 | Disposition: A | Discharge status: Home / Self Care | Payer: BC Managed Care – PPO | Source: Ambulatory Visit | Attending: Cardiology | Admitting: Cardiology

## 2019-01-10 DIAGNOSIS — R0789 Other chest pain: Secondary | ICD-10-CM | POA: Diagnosis not present

## 2019-01-10 MED ORDER — NITROGLYCERIN 0.4 MG SL SUBL
SUBLINGUAL_TABLET | SUBLINGUAL | Status: AC
  Filled 2019-01-10: qty 2

## 2019-01-10 MED ORDER — IOHEXOL 350 MG/ML SOLN
80.0000 mL | Freq: Once | INTRAVENOUS | Status: AC | PRN
  Administered 2019-01-10: 80 mL via INTRAVENOUS

## 2019-01-10 MED ORDER — NITROGLYCERIN 0.4 MG SL SUBL
0.8000 mg | SUBLINGUAL_TABLET | Freq: Once | SUBLINGUAL | Status: AC
  Administered 2019-01-10: 0.8 mg via SUBLINGUAL

## 2019-01-11 ENCOUNTER — Telehealth: Payer: Self-pay | Admitting: Cardiology

## 2019-01-11 NOTE — Telephone Encounter (Signed)
I discussed coronary CTA results with the patient and reassured.  Continue with present medications for better blood pressure control.  Recommended that he continue to follow-up with GI as possible etiology for his chest pain.  I will plan to see him back as scheduled on November 17 for follow-up on hypertension and chest pain.

## 2019-01-31 ENCOUNTER — Ambulatory Visit (INDEPENDENT_AMBULATORY_CARE_PROVIDER_SITE_OTHER): Payer: BC Managed Care – PPO | Admitting: Cardiology

## 2019-01-31 ENCOUNTER — Other Ambulatory Visit: Payer: Self-pay

## 2019-01-31 ENCOUNTER — Encounter: Payer: Self-pay | Admitting: Cardiology

## 2019-01-31 VITALS — BP 131/84 | HR 67 | Temp 98.0°F | Ht 71.0 in | Wt 208.0 lb

## 2019-01-31 DIAGNOSIS — E785 Hyperlipidemia, unspecified: Secondary | ICD-10-CM | POA: Diagnosis not present

## 2019-01-31 DIAGNOSIS — R079 Chest pain, unspecified: Secondary | ICD-10-CM | POA: Diagnosis not present

## 2019-01-31 DIAGNOSIS — I10 Essential (primary) hypertension: Secondary | ICD-10-CM | POA: Diagnosis not present

## 2019-01-31 DIAGNOSIS — Z8249 Family history of ischemic heart disease and other diseases of the circulatory system: Secondary | ICD-10-CM | POA: Diagnosis not present

## 2019-01-31 MED ORDER — AMLODIPINE BESYLATE-VALSARTAN 5-160 MG PO TABS: 1.0000 | ORAL_TABLET | Freq: Every day | ORAL | 3 refills | Status: DC

## 2019-01-31 MED ORDER — METOPROLOL TARTRATE 25 MG PO TABS: 25.0000 mg | ORAL_TABLET | Freq: Two times a day (BID) | ORAL | 3 refills | Status: DC

## 2019-01-31 NOTE — Progress Notes (Signed)
Primary Physician:  Lawerance Cruel, MD   Patient ID: Steve Bennett, male    DOB: 01-Jun-1975, 43 y.o.   MRN: [id number]  Subjective:    Chief Complaint  Patient presents with  . Chest Pain    test results  . Follow-up    HPI: Steve Bennett  is a 43 y.o. male  with GERD, family history of early CAD, followed by [place] for chest pain.   Patient initially began having chest pain in April that was mostly at rest, but now he is having chest pain almost daily associated with exertion.  He has undergone cardiac evaluation with calcium score CT and Lexiscan nuclear stress testing that have been unyielding.  No pain radiation or associated shortness of breath.  He has noticed his chest discomfort particularly with going on hikes and resolves with resting after 1 to 2 minutes.  His episodes are generally very brief.  Denies any dyspnea on exertion or palpitations. No leg edema.  Due to continued symptoms, he is scheduled for coronary CTA next week.  During the interim, he has been evaluated by GI who also felt that his symptoms may be related to GERD.  He has been started on Prilosec 40 mg twice daily.  Depending upon cardiac evaluation they will proceed with further GI evaluation.  He has not noticed any improvement in his chest pain since being on twice a day dosing of omeprazole. Due to hypertension, he was started on amlodipine valsartan at his last visit.  He had dry hacking cough with benazepril.  He continues to notice a hacking cough, but mostly in the morning.  He has not been monitoring his blood pressure regularly.  He denies any history of hyperlipidemia.  States recent lipids performed at his job were within normal limits.  Family history of heart disease in his father with MI in his 78's.   No history of tobacco use.   Past Medical History:  Diagnosis Date  . Hypertension     Past Surgical History:  Procedure Laterality Date  . MICRODISCECTOMY LUMBAR  01/2010     Social History   Socioeconomic History  . Marital status: Single    Spouse name: Not on file  . Number of children: 0  . Years of education: Not on file  . Highest education level: Not on file  Occupational History  . Not on file  Social Needs  . Financial resource strain: Not on file  . Food insecurity    Worry: Not on file    Inability: Not on file  . Transportation needs    Medical: Not on file    Non-medical: Not on file  Tobacco Use  . Smoking status: Former Smoker    Types: Cigarettes  . Smokeless tobacco: Never Used  . Tobacco comment: smoked for a few years in college   Substance and Sexual Activity  . Alcohol use: Yes    Alcohol/week: 0.0 standard drinks    Comment: social use  . Drug use: No  . Sexual activity: Not Currently  Lifestyle  . Physical activity    Days per week: Not on file    Minutes per session: Not on file  . Stress: Not on file  Relationships  . Social Herbalist on phone: Not on file    Gets together: Not on file    Attends religious service: Not on file    Active member of club or organization: Not on file  Attends meetings of clubs or organizations: Not on file    Relationship status: Not on file  . Intimate partner violence    Fear of current or ex partner: Not on file    Emotionally abused: Not on file    Physically abused: Not on file    Forced sexual activity: Not on file  Other Topics Concern  . Not on file  Social History Narrative  . Not on file    Review of Systems  Constitution: Negative for decreased appetite, malaise/fatigue, weight gain and weight loss.  Eyes: Negative for visual disturbance.  Cardiovascular: Positive for chest pain. Negative for claudication, dyspnea on exertion, leg swelling, orthopnea, palpitations and syncope.  Respiratory: Negative for hemoptysis and wheezing.   Endocrine: Negative for cold intolerance and heat intolerance.  Hematologic/Lymphatic: Does not bruise/bleed easily.   Skin: Negative for nail changes.  Musculoskeletal: Negative for muscle weakness and myalgias.  Gastrointestinal: Positive for heartburn. Negative for abdominal pain, change in bowel habit, nausea and vomiting.  Neurological: Negative for difficulty with concentration, dizziness, focal weakness and headaches.  Psychiatric/Behavioral: Negative for altered mental status and suicidal ideas.  All other systems reviewed and are negative.     Objective:  Blood pressure 131/84, pulse 67, temperature 98 F (36.7 C), height 5\' 11"  (1.803 m), weight 208 lb (94.3 kg), SpO2 96 %. Body mass index is 29.01 kg/m.    Physical Exam  Constitutional: He is oriented to person, place, and time. Vital signs are normal. He appears well-developed and well-nourished.  HENT:  Head: Normocephalic and atraumatic.  Neck: Normal range of motion.  Cardiovascular: Normal rate, regular rhythm, normal heart sounds and intact distal pulses.  Pulmonary/Chest: Effort normal and breath sounds normal. No accessory muscle usage. No respiratory distress.  Abdominal: Soft. Bowel sounds are normal.  Musculoskeletal: Normal range of motion.  Neurological: He is alert and oriented to person, place, and time.  Skin: Skin is warm and dry.  Vitals reviewed.  Radiology:  Coronary CTA 01/10/2019:  1. Coronary calcium score of 3. This was 32 percentile for age and sex matched control.  2. Normal coronary origin with right dominance.  3. CAD-RADS 1. Minimal non-obstructive CAD (0-24%). Consider non-atherosclerotic causes of chest pain. Consider preventive therapy and risk factor modification.  Laboratory examination:   11/09/2018: Total cholesterol 201, HDL 79, triglycerides and LDL not performed.  TC/HDL ratio 2.5.  CMP Latest Ref Rng & Units 10/31/2018 08/31/2016  Glucose 65 - 99 mg/dL 95 104(H)  BUN 6 - 24 mg/dL 16 14  Creatinine 0.76 - 1.27 mg/dL 0.87 0.99  Sodium 134 - 144 mmol/L 141 140  Potassium 3.5 - 5.2 mmol/L  4.8 3.6  Chloride 96 - 106 mmol/L 101 103  CO2 20 - 29 mmol/L 26 27  Calcium 8.7 - 10.2 mg/dL 10.1 9.4   CBC Latest Ref Rng & Units 08/31/2016 05/13/2015  WBC 4.0 - 10.5 K/uL 6.0 5.8  Hemoglobin 13.0 - 17.0 g/dL 15.0 15.7  Hematocrit 39.0 - 52.0 % 41.8 46.2  Platelets 150 - 400 K/uL 291 358.0   Lipid Panel  No results found for: CHOL, TRIG, HDL, CHOLHDL, VLDL, LDLCALC, LDLDIRECT HEMOGLOBIN A1C No results found for: HGBA1C, MPG TSH No results for input(s): TSH in the last 8760 hours.  PRN Meds:. There are no discontinued medications. Current Meds  Medication Sig  . amLODipine-valsartan (EXFORGE) 5-160 MG tablet TAKE 1 TABLET BY MOUTH EVERY DAY  . metoprolol tartrate (LOPRESSOR) 25 MG tablet Take 1 tablet (25  mg total) by mouth 2 (two) times daily.  Marland Kitchen omeprazole (PRILOSEC) 40 MG capsule TAKE 1 CAPSULE BY MOUTH TWICE A DAY (Patient taking differently: Take 40 mg by mouth 2 (two) times daily. )    Cardiac Studies:   Echocardiogram 11/03/2018: Left ventricle cavity is normal in size. Normal left ventricular wall thickness. Normal LV systolic function with EF 55%. Normal global wall motion. Normal diastolic filling pattern.  Mild (Grade I) mitral regurgitation. Mild tricuspid regurgitation. Estimated pulmonary artery systolic pressure is 27 mmHg.  Lexiscan nuclear stress test 07/13/2018: The left ventricular ejection fraction is normal (55-65%).  Nuclear stress EF: 55%.  No T wave inversion was noted during stress.  There was no ST segment deviation noted during stress.  This is a low risk study.   No reversible ischemia. LVEF 55% with normal wall motion. This is a low risk study.  Calcium score CT 08/23/2018: Coronary calcium score 0  Assessment:   No diagnosis found. EKG 01/03/2019: Normal sinus rhythm at 68 bpm, normal axis, no evidence of ischemia.  ASCVD 10 year risk: 1.2%  Recommendations:   I have discussed Coronary CTA results with the patient that showed  minimal nonobstructive stenosis in mid LAD. He continues to have occasional chest discomfort with exertion. Given his reassuring cardiac workup, I have recommended further evaluation for non-cardiac etiology for chest pain. I have recommended that he follow back up with GI and if unyielding, may consider pulmonary evaluation.  Blood pressure has improved with current medications, will continue the same.   We have discussed indications of statin therapy. His ASCVD risk is low; however, in view of his slightly elevated LDL and family history of CAD, could consider statin therapy. He is slightly reluctant to starting at this time. I would recommend repeating his lipids in the next few months after making diet changes for reevaluation. I have also encouraged him to discuss with his PCP in the next few months as well. He would prefer to see [place] back in 1 year for follow up, encouraged him to contact us sooner if needed.   [name], MSN, APRN, FNP-C [facility]. [place] Office: [phone] Fax: [phone]

## 2019-02-13 DIAGNOSIS — Z0000 Encounter for general adult medical examination without abnormal findings: Secondary | ICD-10-CM | POA: Diagnosis not present

## 2019-03-01 ENCOUNTER — Other Ambulatory Visit: Payer: Self-pay | Admitting: Physician Assistant

## 2019-05-26 DIAGNOSIS — R202 Paresthesia of skin: Secondary | ICD-10-CM | POA: Diagnosis not present

## 2019-06-09 DIAGNOSIS — R200 Anesthesia of skin: Secondary | ICD-10-CM | POA: Diagnosis not present

## 2019-07-10 DIAGNOSIS — R200 Anesthesia of skin: Secondary | ICD-10-CM | POA: Diagnosis not present

## 2019-08-23 DIAGNOSIS — R200 Anesthesia of skin: Secondary | ICD-10-CM | POA: Diagnosis not present

## 2019-10-09 DIAGNOSIS — Z20822 Contact with and (suspected) exposure to covid-19: Secondary | ICD-10-CM | POA: Diagnosis not present

## 2019-10-14 DIAGNOSIS — Z03818 Encounter for observation for suspected exposure to other biological agents ruled out: Secondary | ICD-10-CM | POA: Diagnosis not present

## 2019-10-14 DIAGNOSIS — Z20822 Contact with and (suspected) exposure to covid-19: Secondary | ICD-10-CM | POA: Diagnosis not present

## 2019-10-23 DIAGNOSIS — R200 Anesthesia of skin: Secondary | ICD-10-CM | POA: Diagnosis not present

## 2019-11-17 ENCOUNTER — Other Ambulatory Visit: Payer: BC Managed Care – PPO

## 2019-11-17 ENCOUNTER — Other Ambulatory Visit: Payer: Self-pay

## 2019-11-17 DIAGNOSIS — Z20822 Contact with and (suspected) exposure to covid-19: Secondary | ICD-10-CM

## 2019-11-18 LAB — NOVEL CORONAVIRUS, NAA: SARS-CoV-2, NAA: DETECTED — AB

## 2019-11-23 DIAGNOSIS — U071 COVID-19: Secondary | ICD-10-CM | POA: Diagnosis not present

## 2019-11-24 DIAGNOSIS — Z20822 Contact with and (suspected) exposure to covid-19: Secondary | ICD-10-CM | POA: Diagnosis not present

## 2019-11-24 DIAGNOSIS — Z03818 Encounter for observation for suspected exposure to other biological agents ruled out: Secondary | ICD-10-CM | POA: Diagnosis not present

## 2019-11-25 DIAGNOSIS — Z20822 Contact with and (suspected) exposure to covid-19: Secondary | ICD-10-CM | POA: Diagnosis not present

## 2019-11-25 DIAGNOSIS — Z03818 Encounter for observation for suspected exposure to other biological agents ruled out: Secondary | ICD-10-CM | POA: Diagnosis not present

## 2019-12-30 ENCOUNTER — Ambulatory Visit (INDEPENDENT_AMBULATORY_CARE_PROVIDER_SITE_OTHER): Payer: BC Managed Care – PPO

## 2019-12-30 ENCOUNTER — Ambulatory Visit (HOSPITAL_COMMUNITY)
Admission: EM | Admit: 2019-12-30 | Discharge: 2019-12-30 | Disposition: A | Discharge status: Home / Self Care | Payer: BC Managed Care – PPO | Attending: Family Medicine | Admitting: Family Medicine

## 2019-12-30 ENCOUNTER — Other Ambulatory Visit: Payer: Self-pay

## 2019-12-30 ENCOUNTER — Encounter (HOSPITAL_COMMUNITY): Payer: Self-pay | Admitting: Emergency Medicine

## 2019-12-30 DIAGNOSIS — M4184 Other forms of scoliosis, thoracic region: Secondary | ICD-10-CM | POA: Diagnosis not present

## 2019-12-30 DIAGNOSIS — M546 Pain in thoracic spine: Secondary | ICD-10-CM

## 2019-12-30 DIAGNOSIS — W108XXS Fall (on) (from) other stairs and steps, sequela: Secondary | ICD-10-CM

## 2019-12-30 NOTE — Discharge Instructions (Addendum)
The x-ray of the thoracic spine was normal today.  Recommend heat applications to increase comfort ibuprofen 600 mg twice daily as needed for pain.  Pain should resolve within the next 3 to 5 days.  If any symptoms worsen or become severe would recommend ER follow-up with primary care for advanced imaging.

## 2019-12-30 NOTE — ED Provider Notes (Signed)
[facility]    CSN: [id number] Arrival date & time: 12/30/19  1151      History   Chief Complaint Chief Complaint  Patient presents with  . Back Pain  . Fall    HPI Steve Bennett is a 44 y.o. male.   HPI  Patient presents today following a slip and fall injury that occurred about 5 days ago.  Patient recalls falling and hitting the upper mid thoracic portion of his back directly against the steps.  He initially thought pain was resolving on its own however he had a hard sneeze today and experienced some severe pain which caused an inability to walk for short period of time and he has currently having pain with deep breathing and talking and certain movements in the same area in which he landed on the steps 5 days ago.  He has not taken any medication today he is not having any difficulty walking.  He has some light bruising in the area in which he made contact with the steps on his upper mid back.  Patient is concerned for a  vertebrae  fracture. Past Medical History:  Diagnosis Date  . Hypertension     Patient Active Problem List   Diagnosis Date Noted  . Chest pain of uncertain etiology 01/77/9390  . Educated about COVID-19 virus infection 07/04/2018    Past Surgical History:  Procedure Laterality Date  . MICRODISCECTOMY LUMBAR  01/2010       Home Medications    Prior to Admission medications   Medication Sig Start Date End Date Taking? Authorizing Provider  amLODipine-valsartan (EXFORGE) 5-160 MG tablet Take 1 tablet by mouth daily. 01/31/19   [name], NP  metoprolol tartrate (LOPRESSOR) 25 MG tablet Take 1 tablet (25 mg total) by mouth 2 (two) times daily. 01/31/19 05/01/19  [name], NP  omeprazole (PRILOSEC) 40 MG capsule TAKE 1 CAPSULE BY MOUTH TWICE A DAY Patient taking differently: Take 40 mg by mouth 2 (two) times daily.  12/22/18   Levin Erp, PA    Family History Family History  Problem  Relation Age of Onset  . Cancer Mother   . Diabetes Mother   . Heart disease Father   . Hyperlipidemia Father   . Cancer Sister   . Diabetes Maternal Grandmother   . Stroke Paternal Grandmother     Social History Social History   Tobacco Use  . Smoking status: Former Smoker    Types: Cigarettes  . Smokeless tobacco: Never Used  . Tobacco comment: smoked for a few years in college   Vaping Use  . Vaping Use: Never used  Substance Use Topics  . Alcohol use: Yes    Alcohol/week: 0.0 standard drinks    Comment: social use  . Drug use: No     Allergies   Patient has no known allergies.   Review of Systems Review of Systems Pertinent negatives listed in HPI Physical Exam Triage Vital Signs ED Triage Vitals  Enc Vitals Group     BP 12/30/19 1426 128/80     Pulse Rate 12/30/19 1426 62     Resp 12/30/19 1426 16     Temp 12/30/19 1426 97.9 F (36.6 C)     Temp Source 12/30/19 1426 Oral     SpO2 12/30/19 1426 100 %     Weight --      Height --      Head Circumference --  Peak Flow --      Pain Score 12/30/19 1425 6     Pain Loc --      Pain Edu? --      Excl. in [place]? --    No data found.  Updated Vital Signs BP 128/80 (BP Location: Left Arm)   Pulse 62   Temp 97.9 F (36.6 C) (Oral)   Resp 16   SpO2 100%   Visual Acuity Right Eye Distance:   Left Eye Distance:   Bilateral Distance:    Right Eye Near:   Left Eye Near:    Bilateral Near:     Physical Exam Constitutional:      Appearance: Normal appearance.  HENT:     Head: Normocephalic.     Nose: Nose normal.  Cardiovascular:     Rate and Rhythm: Normal rate and regular rhythm.  Pulmonary:     Effort: Pulmonary effort is normal.  Musculoskeletal:       Arms:     Cervical back: Normal range of motion and neck supple.  Neurological:     General: No focal deficit present.     Mental Status: He is alert and oriented to person, place, and time.  Psychiatric:        Mood and Affect: Mood  normal.      UC Treatments / Results  Labs (all labs ordered are listed, but only abnormal results are displayed) Labs Reviewed - No data to display  EKG   Radiology DG Thoracic Spine 2 View  Result Date: 12/30/2019 CLINICAL DATA:  Recent fall with dorsalgia EXAM: THORACIC SPINE 2 VIEWS COMPARISON:  None. FINDINGS: Frontal and lateral views were obtained. There is slight upper thoracic levoscoliosis. No fracture or spondylolisthesis. The disc spaces appear unremarkable. No erosive change or paraspinous lesion. Visualized upper lung regions are clear. IMPRESSION: Slight upper thoracic levoscoliosis. No fracture or spondylolisthesis. No appreciable arthropathic change. Electronically Signed   By: Lowella Grip III M.D.   On: 12/30/2019 15:34    Procedures Procedures (including critical care time)  Medications Ordered in UC Medications - No data to display  Initial Impression / Assessment and Plan / UC Course  I have reviewed the triage vital signs and the nursing notes.  Pertinent labs & imaging results that were available during my care of the patient were reviewed by me and considered in my medical decision making (see chart for details).    No acute thoracic fracture or rib fracture seen.  Encourage patient to take ibuprofen 600 mg at least twice per day over the next few days and apply heat to affected area.  Encouraged to advance activity as tolerated.  Patient declined muscle relaxer will continue with conservative measurements at home.  Advised to follow-up with primary care if symptoms worsen or do not resolve within a short period of time. Final Clinical Impressions(s) / UC Diagnoses   Final diagnoses:  Fall (on) (from) other stairs and steps, sequela  Acute left-sided thoracic back pain     Discharge Instructions     The x-ray of the thoracic spine was normal today.  Recommend heat applications to increase comfort ibuprofen 600 mg twice daily as needed for pain.   Pain should resolve within the next 3 to 5 days.  If any symptoms worsen or become severe would recommend ER follow-up with primary care for advanced imaging.    ED Prescriptions    None     PDMP not reviewed this encounter.  Scot Jun, FNP 12/30/19 [phone]

## 2019-12-30 NOTE — ED Triage Notes (Signed)
Pt presents with back pain after slipping and falling on steps xs 5 days ago.

## 2020-01-01 DIAGNOSIS — Z20822 Contact with and (suspected) exposure to covid-19: Secondary | ICD-10-CM | POA: Diagnosis not present

## 2020-01-31 ENCOUNTER — Ambulatory Visit: Payer: BC Managed Care – PPO | Admitting: Cardiology

## 2020-02-01 ENCOUNTER — Ambulatory Visit: Payer: BC Managed Care – PPO | Admitting: Cardiology

## 2020-02-01 ENCOUNTER — Encounter: Payer: Self-pay | Admitting: Cardiology

## 2020-02-01 ENCOUNTER — Other Ambulatory Visit: Payer: Self-pay

## 2020-02-01 VITALS — BP 134/86 | HR 59 | Ht 71.0 in | Wt 208.0 lb

## 2020-02-01 DIAGNOSIS — I2584 Coronary atherosclerosis due to calcified coronary lesion: Secondary | ICD-10-CM | POA: Diagnosis not present

## 2020-02-01 DIAGNOSIS — E785 Hyperlipidemia, unspecified: Secondary | ICD-10-CM | POA: Diagnosis not present

## 2020-02-01 DIAGNOSIS — I2510 Atherosclerotic heart disease of native coronary artery without angina pectoris: Secondary | ICD-10-CM | POA: Diagnosis not present

## 2020-02-01 DIAGNOSIS — Z87891 Personal history of nicotine dependence: Secondary | ICD-10-CM

## 2020-02-01 DIAGNOSIS — Z8249 Family history of ischemic heart disease and other diseases of the circulatory system: Secondary | ICD-10-CM

## 2020-02-01 DIAGNOSIS — I10 Essential (primary) hypertension: Secondary | ICD-10-CM

## 2020-02-01 MED ORDER — METOPROLOL SUCCINATE ER 50 MG PO TB24: 50.0000 mg | ORAL_TABLET | Freq: Every morning | ORAL | 0 refills | Status: DC

## 2020-02-01 MED ORDER — VALSARTAN 160 MG PO TABS: 160.0000 mg | ORAL_TABLET | Freq: Every evening | ORAL | 0 refills | Status: DC

## 2020-02-01 MED ORDER — ATORVASTATIN CALCIUM 20 MG PO TABS: 20.0000 mg | ORAL_TABLET | Freq: Every day | ORAL | 0 refills | Status: DC

## 2020-02-01 MED ORDER — ASPIRIN EC 81 MG PO TBEC: 81.0000 mg | DELAYED_RELEASE_TABLET | Freq: Every day | ORAL | 3 refills | Status: AC

## 2020-02-01 NOTE — Progress Notes (Signed)
Steve Bennett Date of Birth: [birth year] MRN: [id number] Primary Care Provider:Ross, Dwyane Luo, MD Former Cardiology Providers: Jeri Lager, APRN, FNP-C Primary Cardiologist: Rex Kras, DO, [facility] (established care 02/01/2020)  Date: 02/01/20 Last Visit: 01/31/2019 with Steve Bennett  Chief Complaint  Patient presents with  . Follow-up    1 year follow up hx of coronary artery calcification.     HPI  Steve Bennett is a 44 y.o.  male who presents to the office with a chief complaint of " 1 year follow-up for management of coronary artery calcification." Patient's past medical history and cardiovascular risk factors include: Family history of premature CAD, GERD, mild coronary artery calcification, former smoker, hypertension.  Patient was being followed by Steve Bennett and now is here to reestablish care with me for his 1 year follow-up visit.  In the past patient had episodes of chest discomfort and therefore underwent an extensive cardiovascular evaluation.  He underwent an echocardiogram, stress test, and coronary CTA.  Since last office visit patient states that he is doing well from a cardiovascular standpoint.  He no longer has any chest pain or anginal equivalent.  He is very active for his age and enjoys hiking and runs.  Patient wondering if he should continue BP meds. He does not check his BP at home.  Family history of heart disease with father having an MI in his 89s.  FUNCTIONAL STATUS: Continues to physically active he continues to run and hikes.    ALLERGIES: No Known Allergies  MEDICATION LIST PRIOR TO VISIT: Current Outpatient Medications on File Prior to Visit  Medication Sig Dispense Refill  . omeprazole (PRILOSEC) 40 MG capsule TAKE 1 CAPSULE BY MOUTH TWICE A DAY (Patient taking differently: Take 40 mg by mouth 2 (two) times daily. ) 60 capsule 0   No current facility-administered medications on file prior to visit.    PAST MEDICAL HISTORY: Past  Medical History:  Diagnosis Date  . Coronary artery calcification of native artery   . GERD (gastroesophageal reflux disease)   . Hyperlipidemia   . Hypertension     PAST SURGICAL HISTORY: Past Surgical History:  Procedure Laterality Date  . MICRODISCECTOMY LUMBAR  01/2010    FAMILY HISTORY: The patient's family history includes Cancer in his mother and sister; Diabetes in his maternal grandmother and mother; Heart disease in his father; Hyperlipidemia in his father; Stroke in his paternal grandmother.   SOCIAL HISTORY:  The patient  reports that he has quit smoking. His smoking use included cigarettes. He has never used smokeless tobacco. He reports current alcohol use. He reports that he does not use drugs.  Review of Systems  Constitutional: Negative for chills and fever.  HENT: Negative for hoarse voice and nosebleeds.   Eyes: Negative for discharge, double vision and pain.  Cardiovascular: Negative for chest pain, claudication, dyspnea on exertion, leg swelling, near-syncope, orthopnea, palpitations, paroxysmal nocturnal dyspnea and syncope.  Respiratory: Negative for hemoptysis and shortness of breath.   Musculoskeletal: Negative for muscle cramps and myalgias.  Gastrointestinal: Negative for abdominal pain, constipation, diarrhea, hematemesis, hematochezia, melena, nausea and vomiting.  Neurological: Negative for dizziness and light-headedness.  All other systems reviewed and are negative.  PHYSICAL EXAM: Vitals with BMI 02/01/2020 12/30/2019 01/31/2019  Height 5\' 11"  - 5\' 11"   Weight 208 lbs - 208 lbs  BMI 85.46 - 27.03  Systolic 500 938 182  Diastolic 86 80 84  Pulse 59 62 67    CONSTITUTIONAL: Well-developed and well-nourished. No acute distress.  SKIN: Skin is warm and dry. No rash noted. No cyanosis. No pallor. No jaundice HEAD: Normocephalic and atraumatic.  EYES: No scleral icterus MOUTH/THROAT: Moist oral membranes.  NECK: No JVD present. No thyromegaly  noted. No carotid bruits  LYMPHATIC: No visible cervical adenopathy.  CHEST Normal respiratory effort. No intercostal retractions  LUNGS: Clear to auscultation bilaterally. No stridor. No wheezes. No rales.  CARDIOVASCULAR: Regular rate and rhythm, positive S1-S2, no murmurs rubs or gallops appreciated. ABDOMINAL: No apparent ascites.  EXTREMITIES: No peripheral edema  HEMATOLOGIC: No significant bruising NEUROLOGIC: Oriented to person, place, and time. Nonfocal. Normal muscle tone.  PSYCHIATRIC: Normal mood and affect. Normal behavior. Cooperative  RADIOLOGY: Coronary CTA 01/10/2019:  1. Coronary calcium score of 3. This was 11 percentile for age and sex matched control. 2. Normal coronary origin with right dominance. 3. CAD-RADS 1. Minimal non-obstructive CAD (0-24%). Consider non-atherosclerotic causes of chest pain. Consider preventive therapy and risk factor modification.  CARDIAC DATABASE: EKG: 02/01/2020: Normal sinus rhythm, 60 bpm, normal axis, ST changes consistent with early repolarization, without underlying ischemic injury pattern.  Echocardiogram: 11/03/2018:d Left ventricle cavity is normal in size. Normal left ventricular wall thickness. Normal LV systolic function with EF 55%. Normal global wall motion. Normal diastolic filling pattern. Mild (Grade I) mitral regurgitation. Mild tricuspid regurgitation. Estimated pulmonary artery systolic pressure is 27 mmHg.    Stress Testing:  Lexiscan nuclear stress test 07/13/2018: The left ventricular ejection fraction is normal (55-65%).  Nuclear stress EF: 55%.  No T wave inversion was noted during stress.  There was no ST segment deviation noted during stress.  This is a low risk study.  Heart Catheterization: None  LABORATORY DATA: Lipid Panel completed 01/13/2018 HDL 76.000 mg 01/13/2018 LDL 145.000 m 01/13/2018 Cholesterol, total 234.000 m 01/13/2018 Triglycerides 64.000 mg 01/13/2018 A1C N/D  11/09/2018:  Total cholesterol 201, HDL 79, triglycerides and LDL not performed.  TC/HDL ratio 2.5.  IMPRESSION:    ICD-10-CM   1. Coronary artery calcification of native artery  I25.10 metoprolol succinate (TOPROL-XL) 50 MG 24 hr tablet   I25.84 aspirin EC 81 MG tablet    atorvastatin (LIPITOR) 20 MG tablet  2. Mild hyperlipidemia  E78.5 EKG 12-Lead  3. Family history of early CAD  Z82.49 EKG 12-Lead  4. Essential hypertension  I10 metoprolol succinate (TOPROL-XL) 50 MG 24 hr tablet    valsartan (DIOVAN) 160 MG tablet  5. Former smoker  Z87.891      RECOMMENDATIONS: Steve Bennett is a 44 y.o. male whose past medical history and cardiovascular risk factors include: Family history of premature CAD, GERD, mild coronary artery calcification, former smoker, hypertension.  Coronary artery calcification:  Recommend initiation of aspirin and statin therapy.  Patient states that he will be having a fasting lipid profile performed as part of his yearly physical with his PCP in the next couple weeks.  Shared decision was to start statin therapy after the fasting lipid profile was checked.  Benign essential hypertension:  Educated on the importance of low-salt diet.  He is asked to keep a log of his blood pressures to see how they trend at home.  Discontinue Exforge.  Transition to valsartan 160 mg p.o. every afternoon.  If the blood pressure continues to be elevated will increase it at next office visit.  Transition him from Lopressor to Toprol-XL 50 mg p.o. every morning.  Former smoker: Educated on the importance of continued smoking cessation.  Family history of early CAD: Educated on importance of risk  factor modification as discussed above.  FINAL MEDICATION LIST END OF ENCOUNTER: Meds ordered this encounter  Medications  . metoprolol succinate (TOPROL-XL) 50 MG 24 hr tablet    Sig: Take 1 tablet (50 mg total) by mouth in the morning. Take with or immediately following a meal.     Dispense:  90 tablet    Refill:  0  . valsartan (DIOVAN) 160 MG tablet    Sig: Take 1 tablet (160 mg total) by mouth every evening.    Dispense:  90 tablet    Refill:  0  . aspirin EC 81 MG tablet    Sig: Take 1 tablet (81 mg total) by mouth daily. Swallow whole.    Dispense:  90 tablet    Refill:  3  . atorvastatin (LIPITOR) 20 MG tablet    Sig: Take 1 tablet (20 mg total) by mouth at bedtime.    Dispense:  90 tablet    Refill:  0    Medications Discontinued During This Encounter  Medication Reason  . metoprolol tartrate (LOPRESSOR) 25 MG tablet Change in therapy  . amLODipine-valsartan (EXFORGE) 5-160 MG tablet Change in therapy     Current Outpatient Medications:  .  omeprazole (PRILOSEC) 40 MG capsule, TAKE 1 CAPSULE BY MOUTH TWICE A DAY (Patient taking differently: Take 40 mg by mouth 2 (two) times daily. ), Disp: 60 capsule, Rfl: 0 .  aspirin EC 81 MG tablet, Take 1 tablet (81 mg total) by mouth daily. Swallow whole., Disp: 90 tablet, Rfl: 3 .  atorvastatin (LIPITOR) 20 MG tablet, Take 1 tablet (20 mg total) by mouth at bedtime., Disp: 90 tablet, Rfl: 0 .  metoprolol succinate (TOPROL-XL) 50 MG 24 hr tablet, Take 1 tablet (50 mg total) by mouth in the morning. Take with or immediately following a meal., Disp: 90 tablet, Rfl: 0 .  valsartan (DIOVAN) 160 MG tablet, Take 1 tablet (160 mg total) by mouth every evening., Disp: 90 tablet, Rfl: 0  Orders Placed This Encounter  Procedures  . EKG 12-Lead   --Continue cardiac medications as reconciled in final medication list. --Return in about 8 weeks (around 03/28/2020) for Reevaluation of, BP, recent medication changes. Or sooner if needed. --Continue follow-up with your primary care physician regarding the management of your other chronic comorbid conditions.  Patient's questions and concerns were addressed to his satisfaction. He voices understanding of the instructions provided during this encounter.   During this visit I  reviewed and updated: Tobacco history  allergies medication reconciliation  medical history  surgical history  family history  social history.  This note was created using a voice recognition software as a result there may be grammatical errors inadvertently enclosed that do not reflect the nature of this encounter. Every attempt is made to correct such errors.  Rex Kras, [place], [facility]  Pager: [phone] Office: [phone]

## 2020-02-24 ENCOUNTER — Other Ambulatory Visit: Payer: Self-pay | Admitting: Cardiology

## 2020-04-02 ENCOUNTER — Ambulatory Visit: Payer: BC Managed Care – PPO | Admitting: Cardiology

## 2020-04-03 DIAGNOSIS — Z0000 Encounter for general adult medical examination without abnormal findings: Secondary | ICD-10-CM | POA: Diagnosis not present

## 2020-04-03 DIAGNOSIS — E559 Vitamin D deficiency, unspecified: Secondary | ICD-10-CM | POA: Diagnosis not present

## 2020-04-03 DIAGNOSIS — Z13220 Encounter for screening for lipoid disorders: Secondary | ICD-10-CM | POA: Diagnosis not present

## 2020-04-15 ENCOUNTER — Other Ambulatory Visit: Payer: Self-pay

## 2020-04-15 ENCOUNTER — Encounter: Payer: Self-pay | Admitting: Cardiology

## 2020-04-15 ENCOUNTER — Ambulatory Visit: Payer: BC Managed Care – PPO | Admitting: Cardiology

## 2020-04-15 VITALS — BP 138/82 | HR 76 | Temp 97.0°F | Ht 71.0 in | Wt 214.0 lb

## 2020-04-15 DIAGNOSIS — Z8249 Family history of ischemic heart disease and other diseases of the circulatory system: Secondary | ICD-10-CM | POA: Diagnosis not present

## 2020-04-15 DIAGNOSIS — E785 Hyperlipidemia, unspecified: Secondary | ICD-10-CM

## 2020-04-15 DIAGNOSIS — Z87891 Personal history of nicotine dependence: Secondary | ICD-10-CM

## 2020-04-15 DIAGNOSIS — I2510 Atherosclerotic heart disease of native coronary artery without angina pectoris: Secondary | ICD-10-CM

## 2020-04-15 DIAGNOSIS — I10 Essential (primary) hypertension: Secondary | ICD-10-CM | POA: Diagnosis not present

## 2020-04-15 DIAGNOSIS — I2584 Coronary atherosclerosis due to calcified coronary lesion: Secondary | ICD-10-CM

## 2020-04-15 NOTE — Progress Notes (Signed)
Steve Bennett Date of Birth: [birth year] MRN: [id number] Primary Care Provider:Ross, Dwyane Luo, MD Former Cardiology Providers: Jeri Lager, APRN, FNP-C Primary Cardiologist: Rex Kras, DO, [facility] (established care 02/01/2020)  Date: 04/15/20 Last Office Visit: 02/01/2020  Chief Complaint  Patient presents with  . Hypertension  . Follow-up    Coronary artery calcification    HPI  Steve Bennett is a 45 y.o.  male who presents to the office with a chief complaint of " management of coronary artery calcification." Patient's past medical history and cardiovascular risk factors include: Family history of premature CAD, GERD, mild coronary artery calcification, former smoker, hypertension.  Patient is being followed for management of coronary artery calcification and multiple cardiovascular risk factors.  In the past he has undergone extensive cardiovascular evaluation and at the last office visit recommended having a repeat fasting lipid profile performed at his PCPs office and then initiating aspirin and statin therapy.    Patient stated that he wanted to wait for today's office visit prior to starting aspirin and statin therapy.  Patient lipid profile reviewed independently and noted below for further reference.  His estimated 10-year risk of ASCVD is less than 5%, has mild coronary artery calcification, and does have family history of premature coronary artery disease.  Since last office visit patient states that his blood pressure is also improved.  Systolic blood pressure ranges between 517-616 mmHg and diastolic blood pressures average around 85 mmHg.  Since last office visit patient states that he is doing well from a cardiovascular standpoint.  Denies chest pain or anginal equivalent.  He is very active for his age and enjoys hiking and runs.  Family history of heart disease with father having an MI in his 83s.  FUNCTIONAL STATUS: Continues to physically active  he continues to run and hikes.   ALLERGIES: No Known Allergies  MEDICATION LIST PRIOR TO VISIT: Current Outpatient Medications on File Prior to Visit  Medication Sig Dispense Refill  . metoprolol succinate (TOPROL-XL) 50 MG 24 hr tablet Take 1 tablet (50 mg total) by mouth in the morning. Take with or immediately following a meal. 90 tablet 0  . omeprazole (PRILOSEC) 40 MG capsule TAKE 1 CAPSULE BY MOUTH TWICE A DAY (Patient taking differently: Take 40 mg by mouth 2 (two) times daily.) 60 capsule 0  . valsartan (DIOVAN) 160 MG tablet Take 1 tablet (160 mg total) by mouth every evening. 90 tablet 0  . aspirin EC 81 MG tablet Take 1 tablet (81 mg total) by mouth daily. Swallow whole. (Patient not taking: Reported on 04/15/2020) 90 tablet 3  . atorvastatin (LIPITOR) 20 MG tablet Take 1 tablet (20 mg total) by mouth at bedtime. (Patient not taking: Reported on 04/15/2020) 90 tablet 0   No current facility-administered medications on file prior to visit.    PAST MEDICAL HISTORY: Past Medical History:  Diagnosis Date  . Coronary artery calcification of native artery   . GERD (gastroesophageal reflux disease)   . Hyperlipidemia   . Hypertension     PAST SURGICAL HISTORY: Past Surgical History:  Procedure Laterality Date  . MICRODISCECTOMY LUMBAR  01/2010    FAMILY HISTORY: The patient's family history includes Cancer in his mother and sister; Diabetes in his maternal grandmother and mother; Heart disease in his father; Hyperlipidemia in his father; Stroke in his paternal grandmother.   SOCIAL HISTORY:  The patient  reports that he has quit smoking. His smoking use included cigarettes. He has never used smokeless  tobacco. He reports current alcohol use. He reports that he does not use drugs.  Review of Systems  Constitutional: Negative for chills and fever.  HENT: Negative for hoarse voice and nosebleeds.   Eyes: Negative for discharge, double vision and pain.  Cardiovascular:  Negative for chest pain, claudication, dyspnea on exertion, leg swelling, near-syncope, orthopnea, palpitations, paroxysmal nocturnal dyspnea and syncope.  Respiratory: Negative for hemoptysis and shortness of breath.   Musculoskeletal: Negative for muscle cramps and myalgias.  Gastrointestinal: Negative for abdominal pain, constipation, diarrhea, hematemesis, hematochezia, melena, nausea and vomiting.  Neurological: Negative for dizziness and light-headedness.  All other systems reviewed and are negative.  PHYSICAL EXAM: Vitals with BMI 04/15/2020 04/15/2020 02/01/2020  Height - $Remove'5\' 11"'gVKMAVg$  $RemoveBe'5\' 11"'uzPtZGMol$   Weight - 214 lbs 208 lbs  BMI - 16.07 37.10  Systolic 626 948 546  Diastolic 82 92 86  Pulse 76 72 59    CONSTITUTIONAL: Well-developed and well-nourished. No acute distress.  SKIN: Skin is warm and dry. No rash noted. No cyanosis. No pallor. No jaundice HEAD: Normocephalic and atraumatic.  EYES: No scleral icterus MOUTH/THROAT: Moist oral membranes.  NECK: No JVD present. No thyromegaly noted. No carotid bruits  LYMPHATIC: No visible cervical adenopathy.  CHEST Normal respiratory effort. No intercostal retractions  LUNGS: Clear to auscultation bilaterally. No stridor. No wheezes. No rales.  CARDIOVASCULAR: Regular rate and rhythm, positive S1-S2, no murmurs rubs or gallops appreciated. ABDOMINAL: No apparent ascites.  EXTREMITIES: No peripheral edema  HEMATOLOGIC: No significant bruising NEUROLOGIC: Oriented to person, place, and time. Nonfocal. Normal muscle tone.  PSYCHIATRIC: Normal mood and affect. Normal behavior. Cooperative  RADIOLOGY: Coronary CTA 01/10/2019:  1. Coronary calcium score of 3. This was 50 percentile for age and sex matched control. 2. Normal coronary origin with right dominance. 3. CAD-RADS 1. Minimal non-obstructive CAD (0-24%). Consider non-atherosclerotic causes of chest pain. Consider preventive therapy and risk factor modification.  CARDIAC  DATABASE: EKG: 02/01/2020: Normal sinus rhythm, 60 bpm, normal axis, ST changes consistent with early repolarization, without underlying ischemic injury pattern.  Echocardiogram: 11/03/2018: Left ventricle cavity is normal in size. Normal left ventricular wall thickness. Normal LV systolic function with EF 55%. Normal global wall motion. Normal diastolic filling pattern. Mild (Grade I) mitral regurgitation. Mild tricuspid regurgitation. Estimated pulmonary artery systolic pressure is 27 mmHg.    Stress Testing:  Lexiscan nuclear stress test 07/13/2018: The left ventricular ejection fraction is normal (55-65%).  Nuclear stress EF: 55%.  No T wave inversion was noted during stress.  There was no ST segment deviation noted during stress.  This is a low risk study.  Heart Catheterization: None  LABORATORY DATA: External Labs: Collected: 04/03/2020 Creatinine 0.92 mg/dL. eGFR: >60 mL/min per 1.73 m Hemoglobin 17 g/dL, hematocrit 49.3% AST 16, ALT 15, alkaline phosphatase 57 Lipid profile: Total cholesterol 273, triglycerides 142, HDL 75, LDL 173  IMPRESSION:    ICD-10-CM   1. Coronary artery calcification of native artery  I25.10 Lipid Panel With LDL/HDL Ratio   I25.84 LDL cholesterol, direct    CMP14+EGFR  2. Mild hyperlipidemia  E78.5 Lipid Panel With LDL/HDL Ratio    LDL cholesterol, direct  3. Family history of early CAD  Z84.49   12. Essential hypertension  I10   5. Former smoker  Z87.891      RECOMMENDATIONS: Steve Bennett is a 45 y.o. male whose past medical history and cardiovascular risk factors include: Family history of premature CAD, GERD, mild coronary artery calcification, former smoker, hypertension.  Coronary  artery calcification:  Recommend initiation of aspirin and statin therapy.  However, the shared decision at today's office visit was to postpone initiation of statin therapy.  Patient states that he would like to work on lifestyle  modifications to improve his LDL.  If LDL still remains not well controlled he is okay with starting low-dose statin therapy given his family history of premature CAD, mild coronary artery atherosclerosis due to calcified plaque, and mild hyperlipidemia.  Repeat blood work in 3 months.  I will see him in the office to discuss this further.  Benign essential hypertension: Better controlled.  Educated on the importance of low-salt diet.  Continue current medical therapy.  Former smoker: Educated on the importance of continued smoking cessation.  Family history of early CAD: Educated on importance of risk factor modification as discussed above.  FINAL MEDICATION LIST END OF ENCOUNTER: No orders of the defined types were placed in this encounter.   There are no discontinued medications.   Current Outpatient Medications:  .  metoprolol succinate (TOPROL-XL) 50 MG 24 hr tablet, Take 1 tablet (50 mg total) by mouth in the morning. Take with or immediately following a meal., Disp: 90 tablet, Rfl: 0 .  omeprazole (PRILOSEC) 40 MG capsule, TAKE 1 CAPSULE BY MOUTH TWICE A DAY (Patient taking differently: Take 40 mg by mouth 2 (two) times daily.), Disp: 60 capsule, Rfl: 0 .  valsartan (DIOVAN) 160 MG tablet, Take 1 tablet (160 mg total) by mouth every evening., Disp: 90 tablet, Rfl: 0 .  aspirin EC 81 MG tablet, Take 1 tablet (81 mg total) by mouth daily. Swallow whole. (Patient not taking: Reported on 04/15/2020), Disp: 90 tablet, Rfl: 3 .  atorvastatin (LIPITOR) 20 MG tablet, Take 1 tablet (20 mg total) by mouth at bedtime. (Patient not taking: Reported on 04/15/2020), Disp: 90 tablet, Rfl: 0  Orders Placed This Encounter  Procedures  . Lipid Panel With LDL/HDL Ratio  . LDL cholesterol, direct  . CMP14+EGFR   --Continue cardiac medications as reconciled in final medication list. --Return in about 14 weeks (around 07/22/2020) for Coronary artery calcification, Lipid. Or sooner if needed. --Continue  follow-up with your primary care physician regarding the management of your other chronic comorbid conditions.  Patient's questions and concerns were addressed to his satisfaction. He voices understanding of the instructions provided during this encounter.   This note was created using a voice recognition software as a result there may be grammatical errors inadvertently enclosed that do not reflect the nature of this encounter. Every attempt is made to correct such errors.  Rex Kras, [place], [facility]  Pager: [phone] Office: [phone]

## 2020-04-22 ENCOUNTER — Other Ambulatory Visit: Payer: Self-pay | Admitting: Cardiology

## 2020-04-22 DIAGNOSIS — I10 Essential (primary) hypertension: Secondary | ICD-10-CM

## 2020-04-22 DIAGNOSIS — I2510 Atherosclerotic heart disease of native coronary artery without angina pectoris: Secondary | ICD-10-CM

## 2020-04-23 ENCOUNTER — Other Ambulatory Visit: Payer: Self-pay | Admitting: Cardiology

## 2020-04-23 DIAGNOSIS — I10 Essential (primary) hypertension: Secondary | ICD-10-CM

## 2020-04-23 DIAGNOSIS — I2510 Atherosclerotic heart disease of native coronary artery without angina pectoris: Secondary | ICD-10-CM

## 2020-04-23 DIAGNOSIS — I2584 Coronary atherosclerosis due to calcified coronary lesion: Secondary | ICD-10-CM

## 2020-07-11 ENCOUNTER — Encounter: Payer: Self-pay | Admitting: Gastroenterology

## 2020-07-26 ENCOUNTER — Ambulatory Visit: Payer: BC Managed Care – PPO | Admitting: Cardiology

## 2020-08-01 ENCOUNTER — Other Ambulatory Visit: Payer: Self-pay | Admitting: Cardiology

## 2020-08-01 DIAGNOSIS — I2510 Atherosclerotic heart disease of native coronary artery without angina pectoris: Secondary | ICD-10-CM

## 2020-08-01 DIAGNOSIS — I2584 Coronary atherosclerosis due to calcified coronary lesion: Secondary | ICD-10-CM

## 2020-08-01 DIAGNOSIS — I10 Essential (primary) hypertension: Secondary | ICD-10-CM

## 2020-08-08 IMAGING — CT CT HEART SCORING
2 series · 16 of 20 positions shown, 18 images · non-contrast
Comparison: None.
COMPARISON: None.

Addendum:
EXAM:
OVER-READ INTERPRETATION  CT CHEST

The following report is an over-read performed by radiologist Dr.
Odis Hedlund [REDACTED] on 08/23/2018. This over-read
does not include interpretation of cardiac or coronary anatomy or
pathology. The coronary calcium score interpretation by the
cardiologist is attached.
CLINICAL DATA: Risk stratification
Coronary Calcium Score
TECHNIQUE: The patient was scanned on a Siemens Somatom 64 slice scanner. Axial
non-contrast 3 mm slices were carried out through the heart. The
data set was analyzed on a dedicated work station and scored using
the Agatson method.

[Series 2: casc 3.0 i36f 2 bestdiast 72 % · axial · 0.38mm/px · z∈[-240,-120]mm · 8 of 52 slices shown, 10 images]
[im 6/52  vessel]
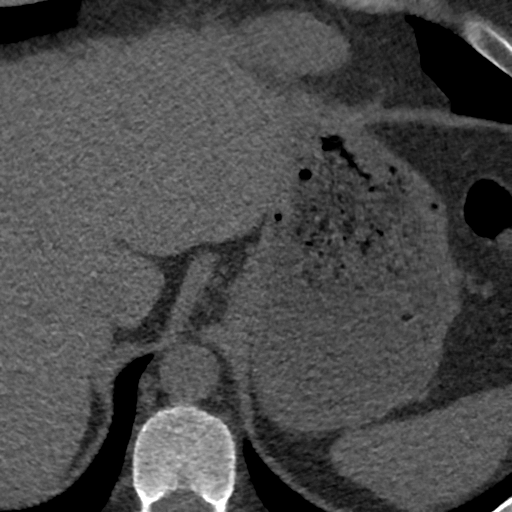
[im 6/52  lung]
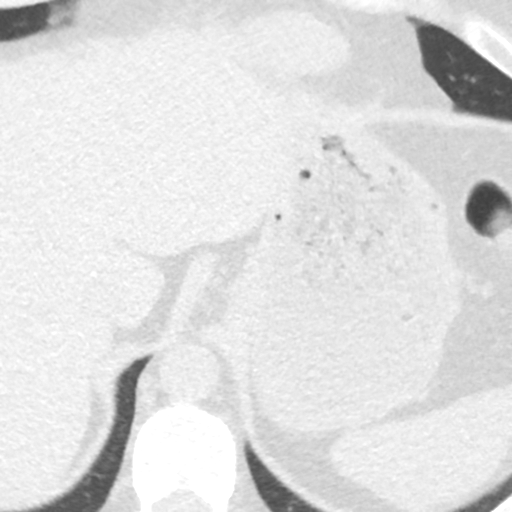
[im 12/52  vessel]
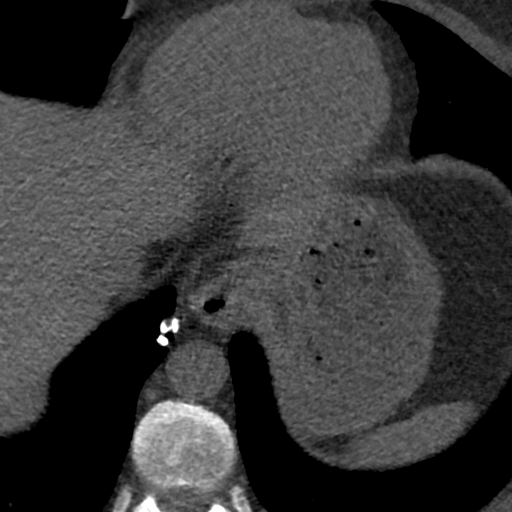
[im 18/52  vessel]
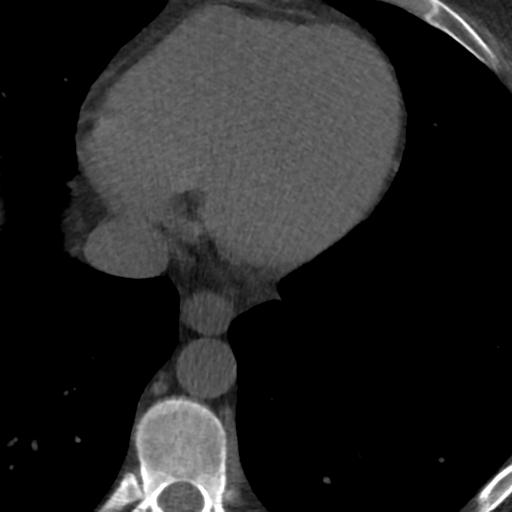
[im 23/52  vessel]
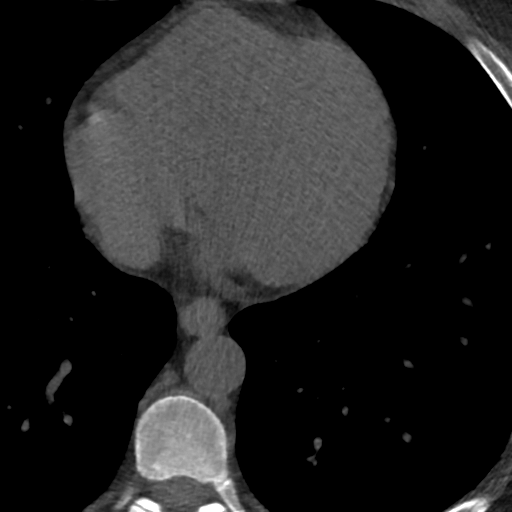
[im 29/52  vessel]
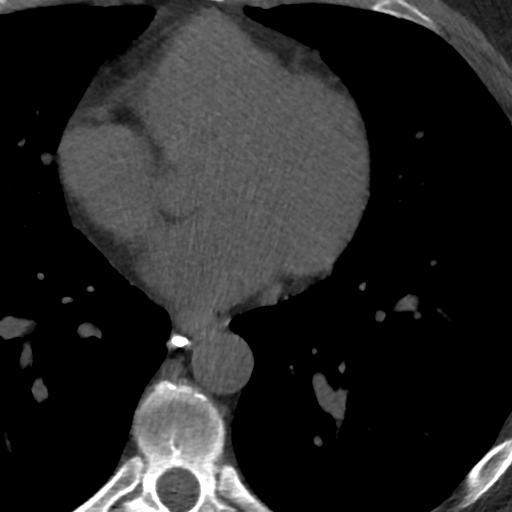
[im 29/52  lung]
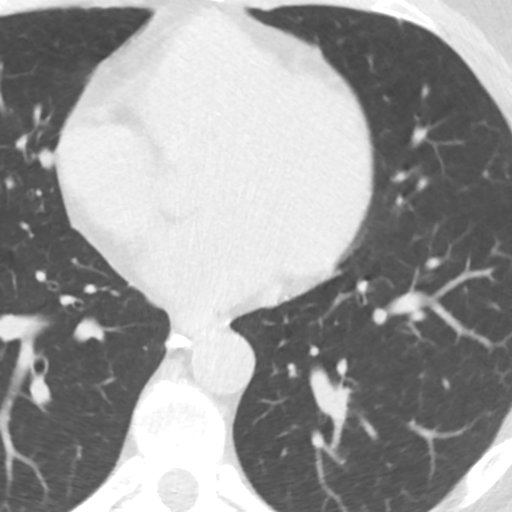
[im 35/52  vessel]
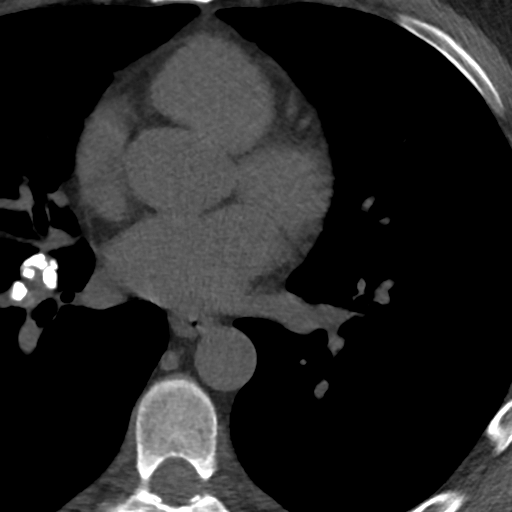
[im 40/52  vessel]
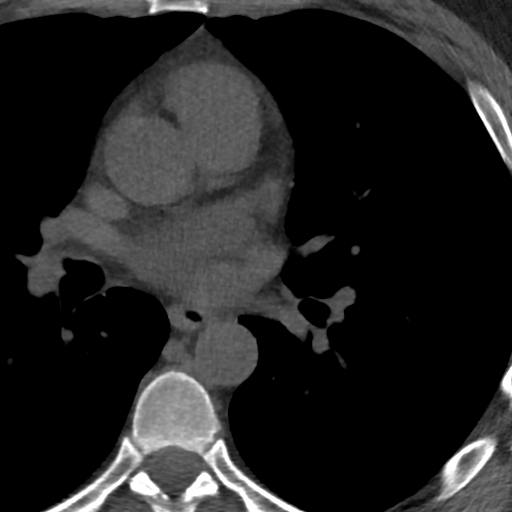
[im 46/52  vessel]
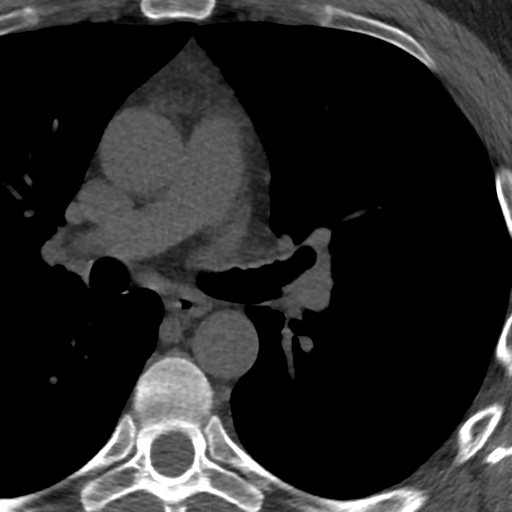

[Series 4: lung st 74 % · axial · 0.75mm/px · z∈[-240,-120]mm · 8 of 52 slices shown]
[im 6/52  lung]
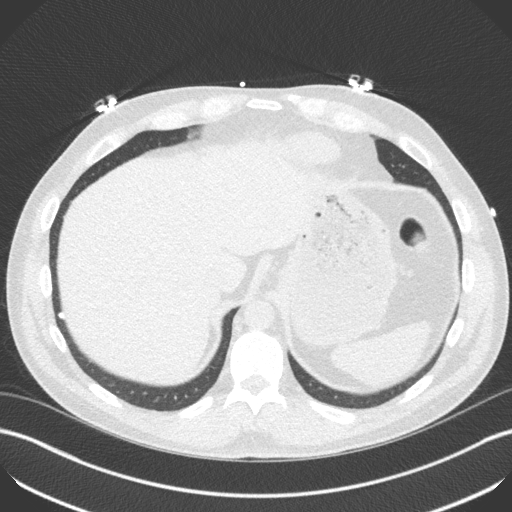
[im 12/52  lung]
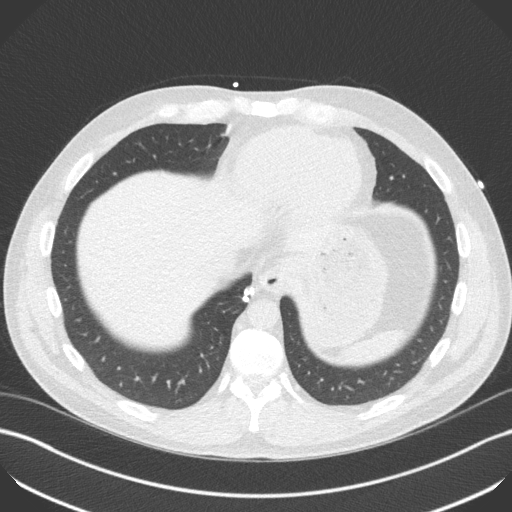
[im 18/52  lung]
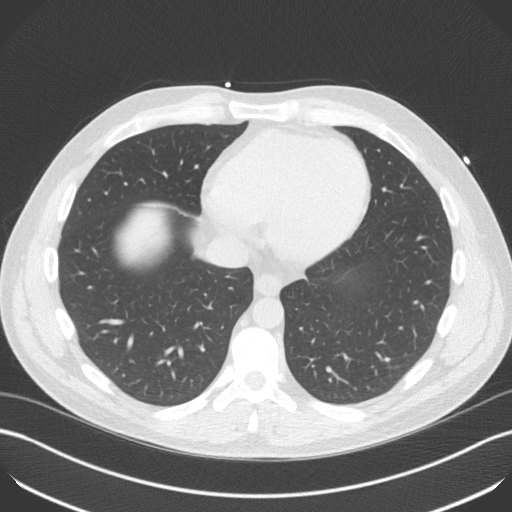
[im 23/52  lung]
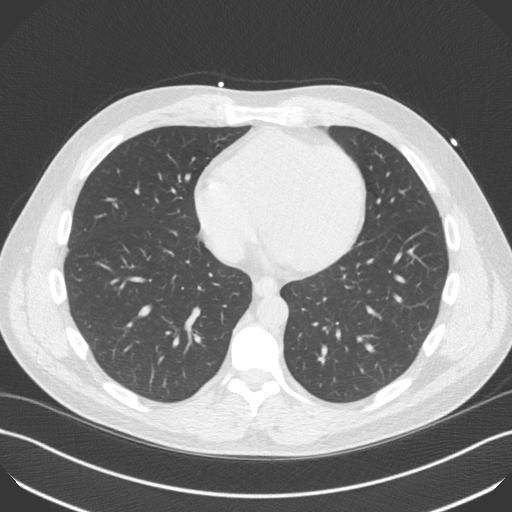
[im 29/52  lung]
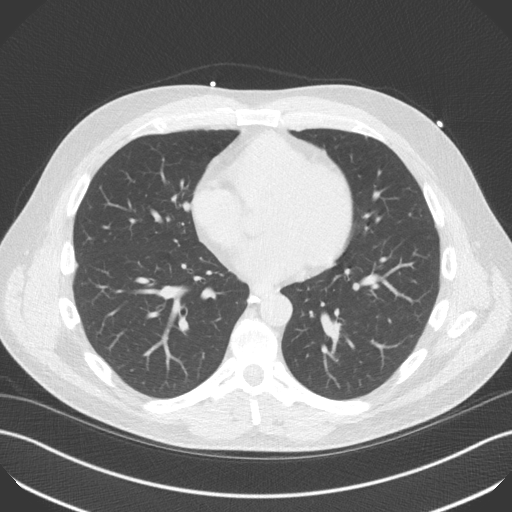
[im 35/52  lung]
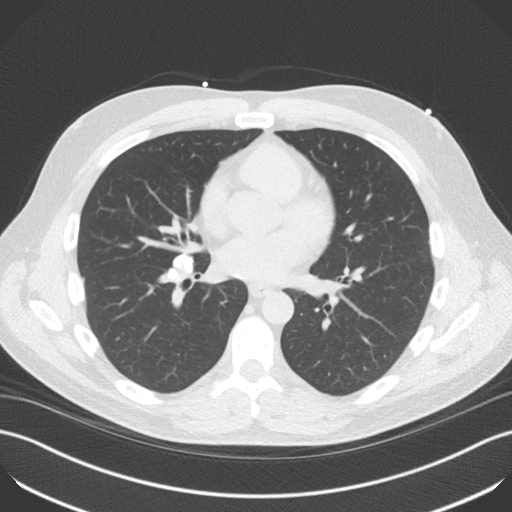
[im 40/52  lung]
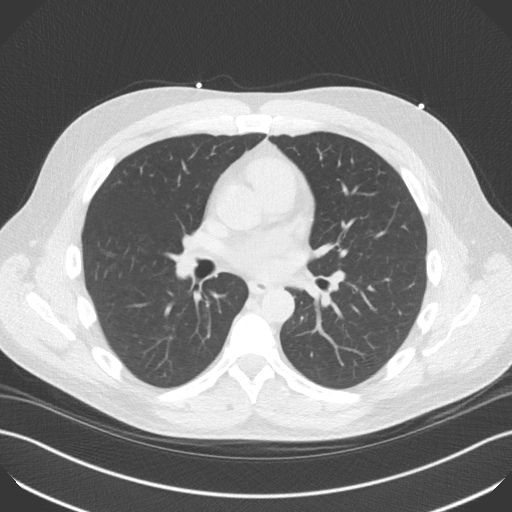
[im 46/52  lung]
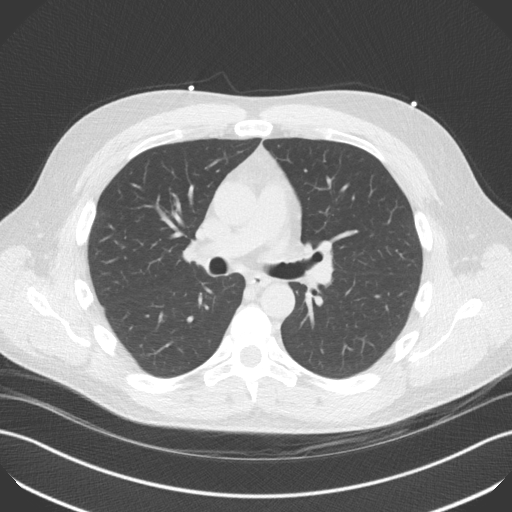

[16 of 20 positions shown; findings below may reference images not displayed]

FINDINGS: Vascular: Heart is normal size.  Visualized aorta normal caliber.

Mediastinum/Nodes: Calcified right hilar and posterior mediastinal
lymph nodes compatible with old granulomatous disease. No abnormal
enlarged mediastinal or hilar lymph nodes.

Lungs/Pleura: Calcified granulomas in the right lower lobe. No
confluent opacity or effusion.

Upper Abdomen: Imaging into the upper abdomen shows no acute
findings.

Musculoskeletal: Chest wall soft tissues are unremarkable. No acute
bony abnormality.
IMPRESSION: Old granulomatous disease.

No acute extra cardiac abnormality.
FINDINGS: Non-cardiac: See separate report from [REDACTED].

Ascending aorta: Normal diameter 3.3 cm

Pericardium: Normal

Coronary arteries: Tiny area of calcium in mid to distal LAD did not
meet density/area criteria to be scored
IMPRESSION: Coronary calcium score of 0.

Kimone Ga

*** End of Addendum ***
EXAM:
OVER-READ INTERPRETATION  CT CHEST

The following report is an over-read performed by radiologist Dr.
Odis Hedlund [REDACTED] on 08/23/2018. This over-read
does not include interpretation of cardiac or coronary anatomy or
pathology. The coronary calcium score interpretation by the
cardiologist is attached.
FINDINGS: Vascular: Heart is normal size.  Visualized aorta normal caliber.

Mediastinum/Nodes: Calcified right hilar and posterior mediastinal
lymph nodes compatible with old granulomatous disease. No abnormal
enlarged mediastinal or hilar lymph nodes.

Lungs/Pleura: Calcified granulomas in the right lower lobe. No
confluent opacity or effusion.

Upper Abdomen: Imaging into the upper abdomen shows no acute
findings.

Musculoskeletal: Chest wall soft tissues are unremarkable. No acute
bony abnormality.
IMPRESSION: Old granulomatous disease.

No acute extra cardiac abnormality.

## 2020-08-21 IMAGING — CR CHEST - 2 VIEW
2 series · 2 of 2 positions shown · non-contrast
Comparison: None.

CLINICAL DATA: Chest pain.

EXAM:
CHEST - 2 VIEW

[w chest pa *]
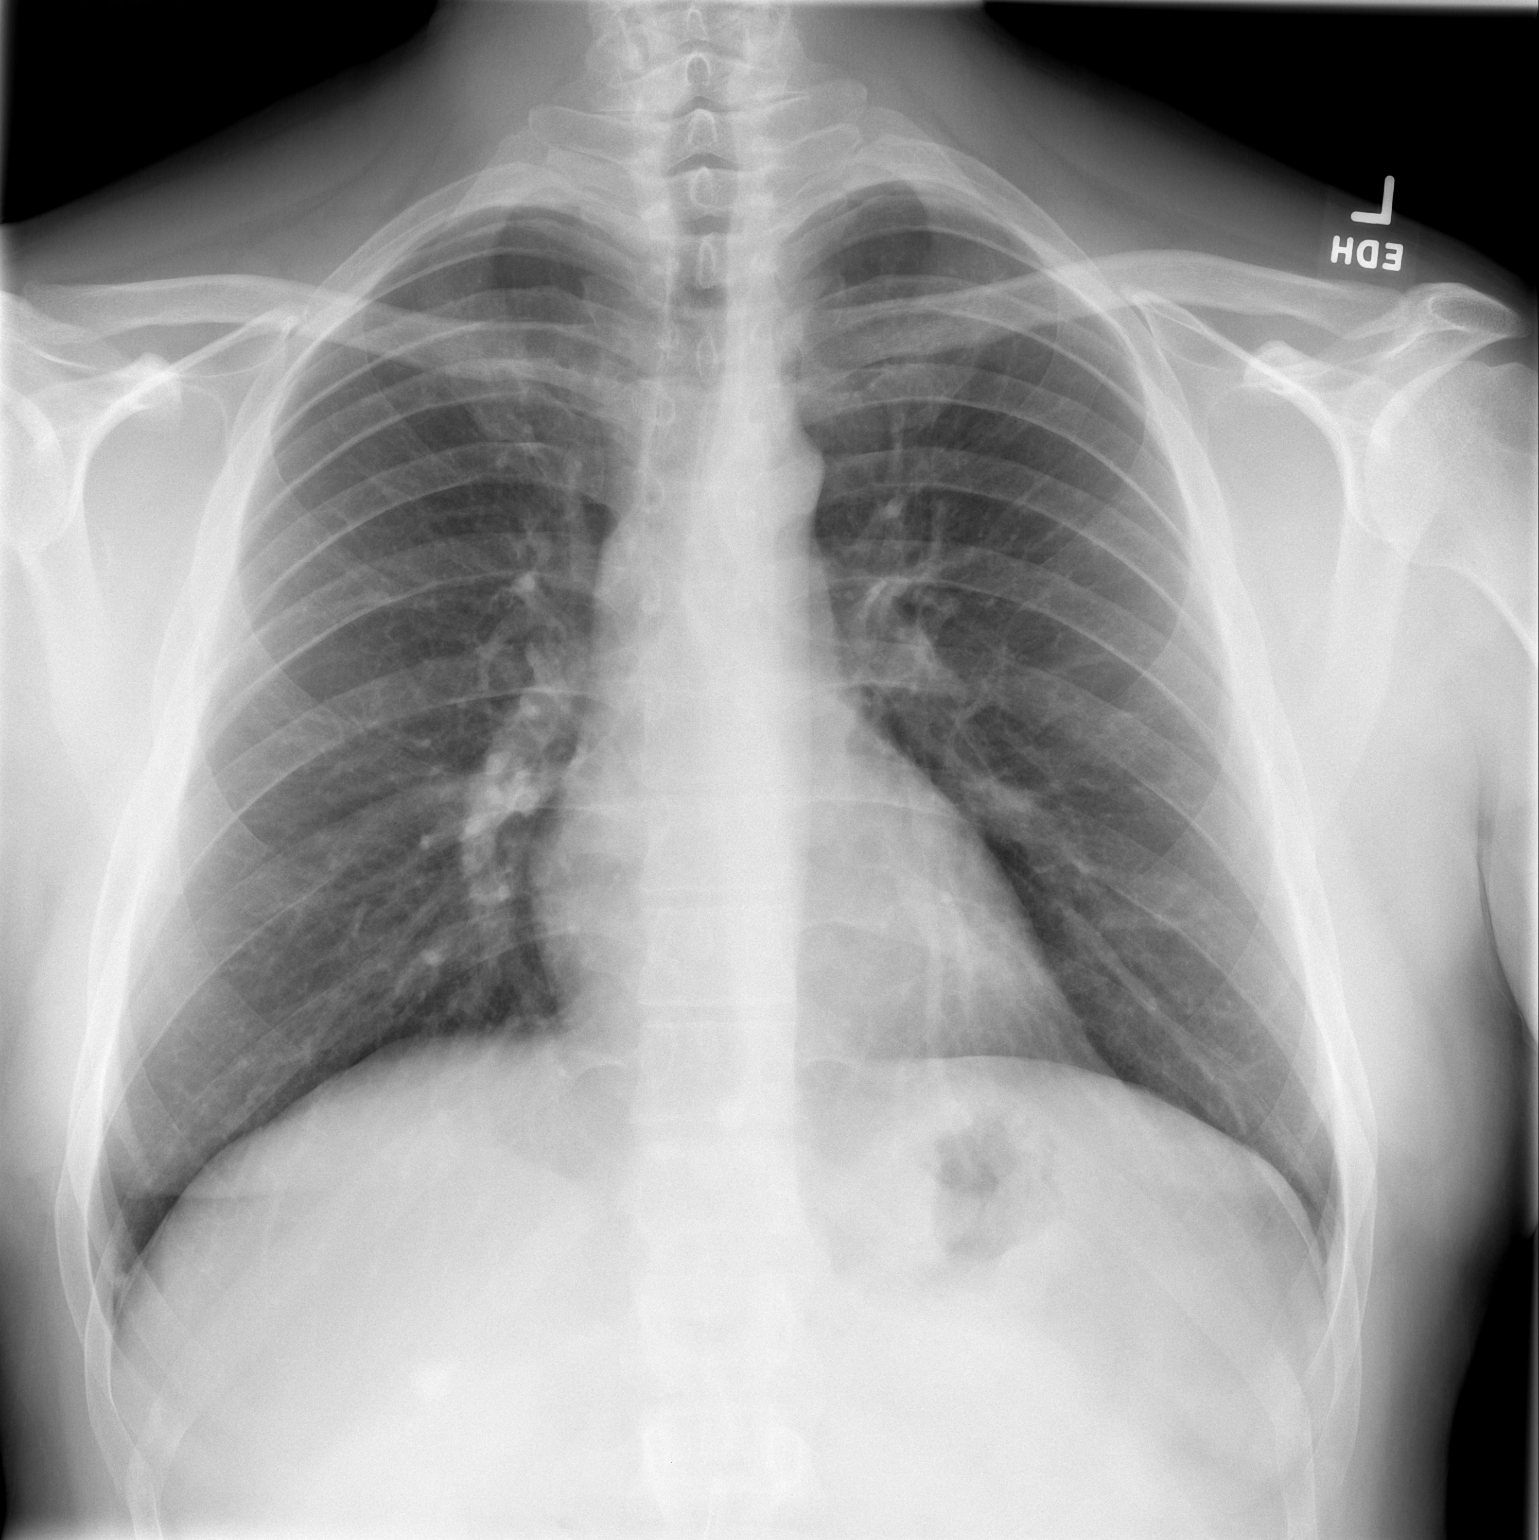

[w chest lat]
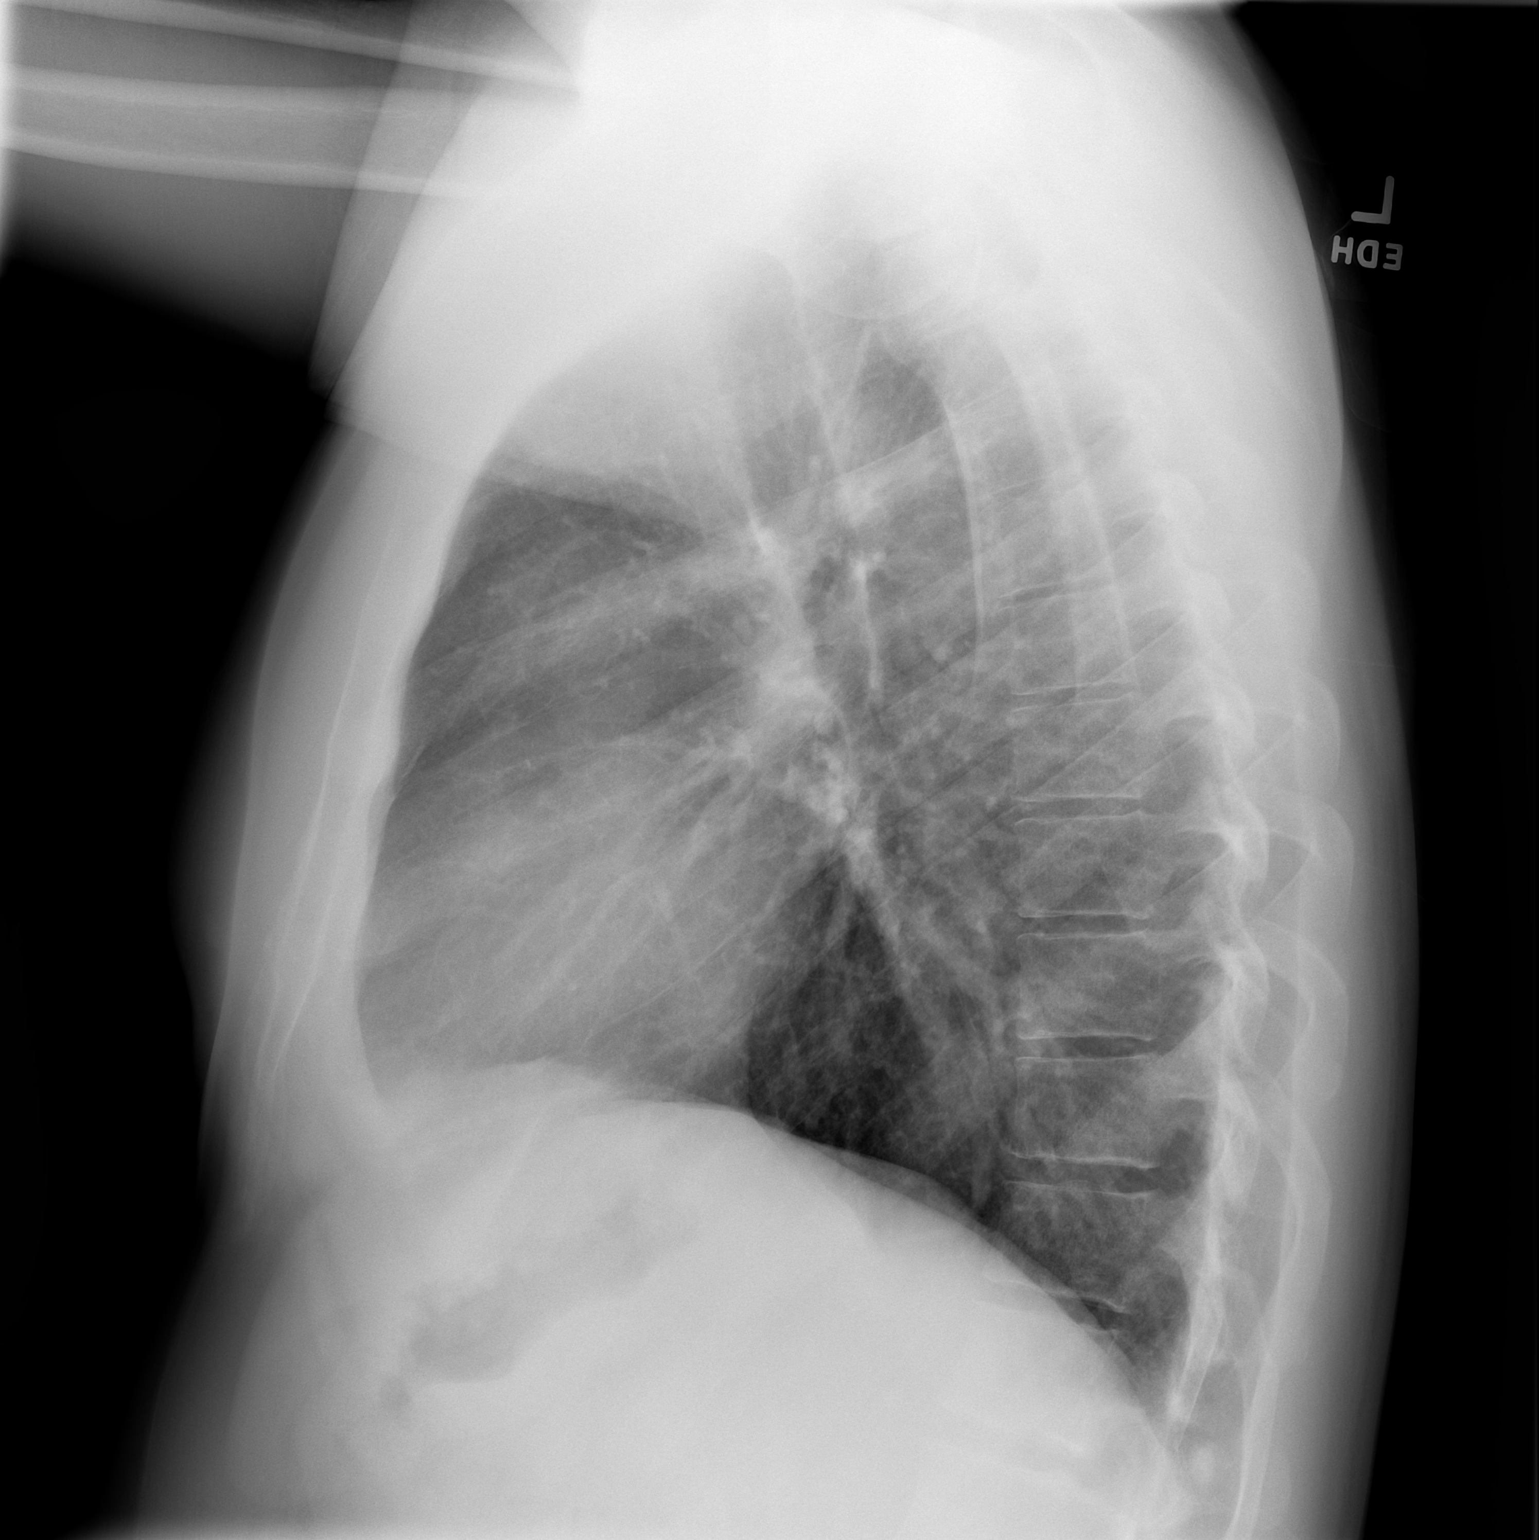

[2 of 2 positions shown; findings below may reference images not displayed]

FINDINGS: The heart size and mediastinal contours are within normal limits.
Both lungs are clear. No pneumothorax or pleural effusion is noted.
The visualized skeletal structures are unremarkable.
IMPRESSION: No active cardiopulmonary disease.

## 2020-08-22 DIAGNOSIS — E785 Hyperlipidemia, unspecified: Secondary | ICD-10-CM | POA: Diagnosis not present

## 2020-08-22 DIAGNOSIS — I2510 Atherosclerotic heart disease of native coronary artery without angina pectoris: Secondary | ICD-10-CM | POA: Diagnosis not present

## 2020-08-22 DIAGNOSIS — I2584 Coronary atherosclerosis due to calcified coronary lesion: Secondary | ICD-10-CM | POA: Diagnosis not present

## 2020-08-23 DIAGNOSIS — Z03818 Encounter for observation for suspected exposure to other biological agents ruled out: Secondary | ICD-10-CM | POA: Diagnosis not present

## 2020-08-23 DIAGNOSIS — J00 Acute nasopharyngitis [common cold]: Secondary | ICD-10-CM | POA: Diagnosis not present

## 2020-08-23 DIAGNOSIS — Z20822 Contact with and (suspected) exposure to covid-19: Secondary | ICD-10-CM | POA: Diagnosis not present

## 2020-08-23 LAB — CMP14+EGFR
ALT: 14 IU/L (ref 0–44)
AST: 15 IU/L (ref 0–40)
Albumin/Globulin Ratio: 1.7 (ref 1.2–2.2)
Albumin: 4.9 g/dL (ref 4.0–5.0)
Alkaline Phosphatase: 78 IU/L (ref 44–121)
BUN/Creatinine Ratio: 33 — ABNORMAL HIGH (ref 9–20)
BUN: 29 mg/dL — ABNORMAL HIGH (ref 6–24)
Bilirubin Total: 0.5 mg/dL (ref 0.0–1.2)
CO2: 24 mmol/L (ref 20–29)
Calcium: 10.1 mg/dL (ref 8.7–10.2)
Chloride: 101 mmol/L (ref 96–106)
Creatinine, Ser: 0.88 mg/dL (ref 0.76–1.27)
Globulin, Total: 2.9 g/dL (ref 1.5–4.5)
Glucose: 105 mg/dL — ABNORMAL HIGH (ref 65–99)
Potassium: 4.8 mmol/L (ref 3.5–5.2)
Sodium: 140 mmol/L (ref 134–144)
Total Protein: 7.8 g/dL (ref 6.0–8.5)
eGFR: 108 mL/min/{1.73_m2} (ref >59)

## 2020-08-23 LAB — LIPID PANEL WITH LDL/HDL RATIO
Cholesterol, Total: 263 mg/dL — ABNORMAL HIGH (ref 100–199)
HDL: 82 mg/dL (ref >39)
LDL Chol Calc (NIH): 170 mg/dL — ABNORMAL HIGH (ref 0–99)
LDL/HDL Ratio: 2.1 ratio (ref 0.0–3.6)
Triglycerides: 68 mg/dL (ref 0–149)
VLDL Cholesterol Cal: 11 mg/dL (ref 5–40)

## 2020-08-23 LAB — LDL CHOLESTEROL, DIRECT: LDL Direct: 155 mg/dL — ABNORMAL HIGH (ref 0–99)

## 2020-08-26 ENCOUNTER — Encounter: Payer: Self-pay | Admitting: Cardiology

## 2020-08-26 ENCOUNTER — Ambulatory Visit: Payer: BC Managed Care – PPO | Admitting: Cardiology

## 2020-08-26 ENCOUNTER — Other Ambulatory Visit: Payer: Self-pay

## 2020-08-26 VITALS — BP 138/82 | HR 73 | Temp 98.0°F | Resp 17 | Ht 71.0 in | Wt 208.0 lb

## 2020-08-26 DIAGNOSIS — I2584 Coronary atherosclerosis due to calcified coronary lesion: Secondary | ICD-10-CM | POA: Diagnosis not present

## 2020-08-26 DIAGNOSIS — Z87891 Personal history of nicotine dependence: Secondary | ICD-10-CM | POA: Diagnosis not present

## 2020-08-26 DIAGNOSIS — I2510 Atherosclerotic heart disease of native coronary artery without angina pectoris: Secondary | ICD-10-CM

## 2020-08-26 DIAGNOSIS — E785 Hyperlipidemia, unspecified: Secondary | ICD-10-CM

## 2020-08-26 DIAGNOSIS — I10 Essential (primary) hypertension: Secondary | ICD-10-CM

## 2020-08-26 NOTE — Progress Notes (Signed)
Steve Bennett Date of Birth: June 04, 1975 MRN: [id number] Primary Care Provider:Ross, Dwyane Luo, MD Former Cardiology Providers: Jeri Lager, APRN, FNP-C Primary Cardiologist: Rex Kras, DO, [facility] (established care 02/01/2020)  Date: 08/26/20 Last Office Visit: 04/15/2020   Chief Complaint  Patient presents with   Hyperlipidemia   Follow-up    6 month  Coronary artery calcification    HPI  Steve Bennett is a 45 y.o.  male who presents to the office with a chief complaint of " 5-monthfollow-up for management of coronary artery calcification and hyperlipidemia." Patient's past medical history and cardiovascular risk factors include: Family history of premature CAD, GERD, mild coronary artery calcification, former smoker, hypertension.  Patient is being followed for management of coronary artery calcification and multiple cardiovascular risk factors.  In the past he has undergone extensive cardiovascular evaluation.  Given his family history of premature CAD, coronary calcium score, and lipid profile he has been encouraged to start statin therapy in the past but prefers to continue with lifestyle modifications.  At the last office visit the shared decision was to implement lifestyle changes and repeat a fasting lipid profile in 6 months to reevaluate therapy.  Since last office visit patient states that he has not been hospitalized for cardiovascular symptoms.  His chest pain is resolved.  He has improved both his diet and increased physical activity and is here to discuss lipid management.    Patient states that his blood pressures at home are relatively stable with a systolic blood pressures ranging between 120-130 mmHg on valsartan and Toprol-XL.  He has been experiencing decreased libido but no erectile dysfunction symptoms.    Family history of heart disease with father having an MI in his 534s  FUNCTIONAL STATUS: Continues to physically active he continues to  run and hikes.   ALLERGIES: No Known Allergies  MEDICATION LIST PRIOR TO VISIT: Current Outpatient Medications on File Prior to Visit  Medication Sig Dispense Refill   aspirin EC 81 MG tablet Take 1 tablet (81 mg total) by mouth daily. Swallow whole. 90 tablet 3   metoprolol succinate (TOPROL-XL) 50 MG 24 hr tablet TAKE 1 TABLET BY MOUTH EVERY MORNING WITH OR IMMEDIATELY FOLLOWING A MEAL 30 tablet 2   omeprazole (PRILOSEC) 40 MG capsule TAKE 1 CAPSULE BY MOUTH TWICE A DAY (Patient taking differently: Take 40 mg by mouth 2 (two) times daily.) 60 capsule 0   valsartan (DIOVAN) 160 MG tablet TAKE 1 TABLET BY MOUTH EVERY DAY IN THE EVENING 30 tablet 2   atorvastatin (LIPITOR) 20 MG tablet TAKE 1 TABLET BY MOUTH EVERYDAY AT BEDTIME 30 tablet 2   No current facility-administered medications on file prior to visit.    PAST MEDICAL HISTORY: Past Medical History:  Diagnosis Date   Coronary artery calcification of native artery    GERD (gastroesophageal reflux disease)    Hyperlipidemia    Hypertension     PAST SURGICAL HISTORY: Past Surgical History:  Procedure Laterality Date   MICRODISCECTOMY LUMBAR  01/2010    FAMILY HISTORY: The patient's family history includes Cancer in his mother and sister; Diabetes in his maternal grandmother and mother; Heart disease in his father; Hyperlipidemia in his father; Stroke in his paternal grandmother.   SOCIAL HISTORY:  The patient  reports that he has quit smoking. His smoking use included cigarettes. He has never used smokeless tobacco. He reports current alcohol use. He reports that he does not use drugs.  Review of Systems  Constitutional: Negative  for chills and fever.  HENT:  Negative for hoarse voice and nosebleeds.   Eyes:  Negative for discharge, double vision and pain.  Cardiovascular:  Negative for chest pain, claudication, dyspnea on exertion, leg swelling, near-syncope, orthopnea, palpitations, paroxysmal nocturnal dyspnea and  syncope.  Respiratory:  Negative for hemoptysis and shortness of breath.   Musculoskeletal:  Negative for muscle cramps and myalgias.  Gastrointestinal:  Negative for abdominal pain, constipation, diarrhea, hematemesis, hematochezia, melena, nausea and vomiting.  Neurological:  Negative for dizziness and light-headedness.  All other systems reviewed and are negative.  PHYSICAL EXAM: Vitals with BMI 08/26/2020 04/15/2020 04/15/2020  Height _0  - _1   Weight 208 lbs - 214 lbs  BMI 69.62 - 95.28  Systolic 413 244 010  Diastolic 82 82 92  Pulse 73 76 72    CONSTITUTIONAL: Well-developed and well-nourished. No acute distress.  SKIN: Skin is warm and dry. No rash noted. No cyanosis. No pallor. No jaundice HEAD: Normocephalic and atraumatic.  EYES: No scleral icterus MOUTH/THROAT: Moist oral membranes.  NECK: No JVD present. No thyromegaly noted. No carotid bruits  LYMPHATIC: No visible cervical adenopathy.  CHEST Normal respiratory effort. No intercostal retractions  LUNGS: Clear to auscultation bilaterally. No stridor. No wheezes. No rales.  CARDIOVASCULAR: Regular rate and rhythm, positive S1-S2, no murmurs rubs or gallops appreciated. ABDOMINAL: No apparent ascites.  EXTREMITIES: No peripheral edema  HEMATOLOGIC: No significant bruising NEUROLOGIC: Oriented to person, place, and time. Nonfocal. Normal muscle tone.  PSYCHIATRIC: Normal mood and affect. Normal behavior. Cooperative  RADIOLOGY: Coronary CTA 01/10/2019:  1. Coronary calcium score of 3. This was 77 percentile for age and sex matched control. 2. Normal coronary origin with right dominance. 3. CAD-RADS 1. Minimal non-obstructive CAD (0-24%). Consider non-atherosclerotic causes of chest pain. Consider preventive therapy and risk factor modification.  CARDIAC DATABASE: EKG: 08/26/2020: Normal sinus rhythm, 74 bpm, normal axis, without underlying ischemia or injury pattern.  Echocardiogram: 11/03/2018: Left  ventricle cavity is normal in size. Normal left ventricular wall thickness. Normal LV systolic function with EF 55%. Normal global wall motion. Normal diastolic filling pattern. Mild (Grade I) mitral regurgitation. Mild tricuspid regurgitation. Estimated pulmonary artery systolic pressure is 27 mmHg.    Stress Testing:  Lexiscan nuclear stress test 07/13/2018: The left ventricular ejection fraction is normal (55-65%). Nuclear stress EF: 55%. No T wave inversion was noted during stress. There was no ST segment deviation noted during stress. This is a low risk study.  Heart Catheterization: None  LABORATORY DATA: External Labs: Collected: 04/03/2020 Creatinine 0.92 mg/dL. eGFR: >60 mL/min per 1.73 m Hemoglobin 17 g/dL, hematocrit 49.3% AST 16, ALT 15, alkaline phosphatase 57 Lipid profile: Total cholesterol 273, triglycerides 142, HDL 75, LDL 173  Lipid profile: Collected: 08/22/2020 Total cholesterol 263, triglycerides 68, HDL 82, direct LDL 155, non-HDL 170.  IMPRESSION:    ICD-10-CM   1. Coronary artery calcification of native artery  I25.10 EKG 12-Lead   I25.84     2. Mild hyperlipidemia  E78.5     3. Former smoker  Z87.891     4. Essential hypertension  U72 Basic metabolic panel    Magnesium       RECOMMENDATIONS: Steve Bennett is a 45 y.o. male whose past medical history and cardiovascular risk factors include: Family history of premature CAD, GERD, mild coronary artery calcification, former smoker, hypertension.  Coronary artery calcification: Most recent labs from 08/22/2020 independently reviewed. Patient's LDL in the past was noted to be 173 mg/dL and  now direct LDL is 155 mg/dL. Continue aspirin 81 mg p.o. daily Given his premature CAD in the family, coronary calcium score, and a direct LDL of 155 mg/dL reemphasized the importance of statin therapy in the clinical setting.  However, patient is adamant that he would like to continue with lifestyle changes  and avoid statin therapy for an additional 6 months and reevaluate lipid profile. Patient is aware that elevated LDL levels would predispose him to ASCVD which includes but not limited to heart attacks, CAD, strokes.  Benign essential hypertension:  Better controlled. Due to decreased libido and no longer having chest pain recommend weaning off of metoprolol succinate. Decrease Toprol-XL to 25 mg p.o. daily x1 week followed by Toprol-XL 25 mg q. other day for 1 week and then stop. On week 3 when he is successfully off of Toprol-XL I have asked him to increase valsartan to 320 mg p.o. every morning Repeat blood work in 1 week thereafter to check kidney function and electrolytes. If labs are within normal limits we will send in a prescription for valsartan 320 mg p.o. daily Low-salt diet recommended  Former smoker: Educated on the importance of continued smoking cessation.  Family history of early CAD: Educated on importance of risk factor modification as discussed above.  FINAL MEDICATION LIST END OF ENCOUNTER: No orders of the defined types were placed in this encounter.   There are no discontinued medications.   Current Outpatient Medications:    aspirin EC 81 MG tablet, Take 1 tablet (81 mg total) by mouth daily. Swallow whole., Disp: 90 tablet, Rfl: 3   metoprolol succinate (TOPROL-XL) 50 MG 24 hr tablet, TAKE 1 TABLET BY MOUTH EVERY MORNING WITH OR IMMEDIATELY FOLLOWING A MEAL, Disp: 30 tablet, Rfl: 2   omeprazole (PRILOSEC) 40 MG capsule, TAKE 1 CAPSULE BY MOUTH TWICE A DAY (Patient taking differently: Take 40 mg by mouth 2 (two) times daily.), Disp: 60 capsule, Rfl: 0   valsartan (DIOVAN) 160 MG tablet, TAKE 1 TABLET BY MOUTH EVERY DAY IN THE EVENING, Disp: 30 tablet, Rfl: 2   atorvastatin (LIPITOR) 20 MG tablet, TAKE 1 TABLET BY MOUTH EVERYDAY AT BEDTIME, Disp: 30 tablet, Rfl: 2  Orders Placed This Encounter  Procedures   Basic metabolic panel   Magnesium   EKG 12-Lead    --Continue cardiac medications as reconciled in final medication list. --Return in about 6 months (around 02/25/2021) for Follow up, Lipid. Or sooner if needed. --Continue follow-up with your primary care physician regarding the management of your other chronic comorbid conditions.  Patient's questions and concerns were addressed to his satisfaction. He voices understanding of the instructions provided during this encounter.   This note was created using a voice recognition software as a result there may be grammatical errors inadvertently enclosed that do not reflect the nature of this encounter. Every attempt is made to correct such errors.  Rex Kras, [place], [facility]  Pager: [phone] Office: [phone]

## 2020-08-27 NOTE — Telephone Encounter (Signed)
Do fast for blood work whenever cholesterol is being checked.   No need to repeat it as I think you probably had nothing to eat prior to your blood work based on your TAG levels.

## 2020-08-27 NOTE — Telephone Encounter (Signed)
From pt

## 2020-08-29 ENCOUNTER — Other Ambulatory Visit: Payer: Self-pay

## 2020-08-29 DIAGNOSIS — I10 Essential (primary) hypertension: Secondary | ICD-10-CM

## 2020-09-17 DIAGNOSIS — I10 Essential (primary) hypertension: Secondary | ICD-10-CM | POA: Diagnosis not present

## 2020-09-18 LAB — BASIC METABOLIC PANEL
BUN/Creatinine Ratio: 24 — ABNORMAL HIGH (ref 9–20)
BUN: 21 mg/dL (ref 6–24)
CO2: 24 mmol/L (ref 20–29)
Calcium: 9.5 mg/dL (ref 8.7–10.2)
Chloride: 100 mmol/L (ref 96–106)
Creatinine, Ser: 0.88 mg/dL (ref 0.76–1.27)
Glucose: 95 mg/dL (ref 65–99)
Potassium: 4.6 mmol/L (ref 3.5–5.2)
Sodium: 137 mmol/L (ref 134–144)
eGFR: 108 mL/min/{1.73_m2} (ref >59)

## 2020-09-18 LAB — MAGNESIUM: Magnesium: 2 mg/dL (ref 1.6–2.3)

## 2020-09-23 ENCOUNTER — Other Ambulatory Visit: Payer: Self-pay | Admitting: Cardiology

## 2020-09-23 DIAGNOSIS — I10 Essential (primary) hypertension: Secondary | ICD-10-CM

## 2020-09-23 MED ORDER — VALSARTAN 320 MG PO TABS: 320.0000 mg | ORAL_TABLET | Freq: Every day | ORAL | 1 refills | Status: DC

## 2020-09-23 NOTE — Progress Notes (Signed)
Called and spoke with patient regarding his lab results. Patient confirmed that these labs were done after weaning off Toprol-XL and going up on valsartan 320 mg p.o. daily.  Prescription can be sent to patients pharmacy he will pick it up later this week.

## 2020-10-02 ENCOUNTER — Other Ambulatory Visit: Payer: Self-pay | Admitting: Cardiology

## 2020-10-02 DIAGNOSIS — I10 Essential (primary) hypertension: Secondary | ICD-10-CM

## 2020-10-02 MED ORDER — VALSARTAN 320 MG PO TABS: 320.0000 mg | ORAL_TABLET | Freq: Every day | ORAL | 1 refills | Status: DC

## 2021-02-21 ENCOUNTER — Other Ambulatory Visit: Payer: Self-pay | Admitting: Cardiology

## 2021-02-21 DIAGNOSIS — I2510 Atherosclerotic heart disease of native coronary artery without angina pectoris: Secondary | ICD-10-CM

## 2021-02-27 ENCOUNTER — Ambulatory Visit: Payer: BC Managed Care – PPO | Admitting: Cardiology

## 2021-03-25 DIAGNOSIS — I2510 Atherosclerotic heart disease of native coronary artery without angina pectoris: Secondary | ICD-10-CM | POA: Diagnosis not present

## 2021-03-25 DIAGNOSIS — I2584 Coronary atherosclerosis due to calcified coronary lesion: Secondary | ICD-10-CM | POA: Diagnosis not present

## 2021-03-26 LAB — CMP14+EGFR
ALT: 42 IU/L (ref 0–44)
AST: 73 IU/L — ABNORMAL HIGH (ref 0–40)
Albumin/Globulin Ratio: 1.7 (ref 1.2–2.2)
Albumin: 4.7 g/dL (ref 4.0–5.0)
Alkaline Phosphatase: 71 IU/L (ref 44–121)
BUN/Creatinine Ratio: 16 (ref 9–20)
BUN: 17 mg/dL (ref 6–24)
Bilirubin Total: 0.7 mg/dL (ref 0.0–1.2)
CO2: 26 mmol/L (ref 20–29)
Calcium: 9.9 mg/dL (ref 8.7–10.2)
Chloride: 101 mmol/L (ref 96–106)
Creatinine, Ser: 1.08 mg/dL (ref 0.76–1.27)
Globulin, Total: 2.8 g/dL (ref 1.5–4.5)
Glucose: 106 mg/dL — ABNORMAL HIGH (ref 70–99)
Potassium: 4.5 mmol/L (ref 3.5–5.2)
Sodium: 139 mmol/L (ref 134–144)
Total Protein: 7.5 g/dL (ref 6.0–8.5)
eGFR: 86 mL/min/{1.73_m2} (ref >59)

## 2021-03-26 LAB — LIPID PANEL WITH LDL/HDL RATIO
Cholesterol, Total: 271 mg/dL — ABNORMAL HIGH (ref 100–199)
HDL: 67 mg/dL (ref >39)
LDL Chol Calc (NIH): 178 mg/dL — ABNORMAL HIGH (ref 0–99)
LDL/HDL Ratio: 2.7 ratio (ref 0.0–3.6)
Triglycerides: 143 mg/dL (ref 0–149)
VLDL Cholesterol Cal: 26 mg/dL (ref 5–40)

## 2021-03-26 LAB — LDL CHOLESTEROL, DIRECT: LDL Direct: 177 mg/dL — ABNORMAL HIGH (ref 0–99)

## 2021-03-27 ENCOUNTER — Ambulatory Visit: Payer: BC Managed Care – PPO | Admitting: Cardiology

## 2021-03-27 ENCOUNTER — Encounter: Payer: Self-pay | Admitting: Cardiology

## 2021-03-27 ENCOUNTER — Other Ambulatory Visit: Payer: Self-pay

## 2021-03-27 VITALS — BP 137/86 | HR 86 | Temp 98.1°F | Resp 16 | Ht 71.0 in | Wt 213.8 lb

## 2021-03-27 DIAGNOSIS — E7800 Pure hypercholesterolemia, unspecified: Secondary | ICD-10-CM | POA: Diagnosis not present

## 2021-03-27 DIAGNOSIS — I10 Essential (primary) hypertension: Secondary | ICD-10-CM

## 2021-03-27 DIAGNOSIS — I2510 Atherosclerotic heart disease of native coronary artery without angina pectoris: Secondary | ICD-10-CM

## 2021-03-27 DIAGNOSIS — Z8249 Family history of ischemic heart disease and other diseases of the circulatory system: Secondary | ICD-10-CM

## 2021-03-27 DIAGNOSIS — Z87891 Personal history of nicotine dependence: Secondary | ICD-10-CM

## 2021-03-27 DIAGNOSIS — I2584 Coronary atherosclerosis due to calcified coronary lesion: Secondary | ICD-10-CM | POA: Diagnosis not present

## 2021-03-27 NOTE — Progress Notes (Signed)
Steve Bennett Date of Birth: August 24, 1975 MRN: [id number] Primary Care Provider:Ross, Dwyane Luo, MD Former Cardiology Providers: Jeri Lager, APRN, FNP-C Primary Cardiologist: Rex Kras, DO, [facility] (established care 02/01/2020)  Date: 03/27/21 Last Office Visit: 08/26/2020   Chief Complaint  Patient presents with   Hyperlipidemia   Follow-up    HPI  Steve Bennett is a 46 y.o.  male who presents to the office with a chief complaint of " 75-monthfollow-up for management of hyperlipidemia." Patient's past medical history and cardiovascular risk factors include: Family history of premature CAD, GERD, mild coronary artery calcification, hyperlipidemia, former smoker, hypertension.  Patient being followed by the practice for coronary artery calcification, hyperlipidemia, and other cardiovascular risk factors.  In the past patient has undergone a coronary calcium score, coronary CTA, echocardiogram.  Results reviewed as part of decision-making process today.  In the past he has been recommended to be on statin therapy given his mild CAC with an elevated percentile, family history of premature CAD, and hyperlipidemia.  However, patient prefers to treated medically.  He is here for 659-monthollow-up visit.  Recent labs from 03/25/2021 independently reviewed which illustrates a direct LDL 177 mg/dL.  Clinically he denies any angina factors but randomly does have noncardiac discomfort but overall intensity, frequency, duration has not changed.  He denies orthopnea, paroxysmal nocturnal dyspnea or lower extremity swelling.  Last office visit patient was experiencing decreased libido but no erectile dysfunction symptoms.  He was weaned off of Toprol-XL and is currently on valsartan.  Home blood pressures are around 130 mmHg, per patient  Family history of heart disease with father having an MI in his 5084s FUNCTIONAL STATUS: Continues to physically active he continues to run  and hikes.   ALLERGIES: No Known Allergies  MEDICATION LIST PRIOR TO VISIT: Current Outpatient Medications on File Prior to Visit  Medication Sig Dispense Refill   aspirin EC 81 MG tablet Take 81 mg by mouth daily. Swallow whole.     omeprazole (PRILOSEC) 40 MG capsule TAKE 1 CAPSULE BY MOUTH TWICE A DAY (Patient taking differently: Take 40 mg by mouth 2 (two) times daily.) 60 capsule 0   valsartan (DIOVAN) 320 MG tablet Take 1 tablet (320 mg total) by mouth daily. 90 tablet 1   No current facility-administered medications on file prior to visit.    PAST MEDICAL HISTORY: Past Medical History:  Diagnosis Date   Coronary artery calcification of native artery    GERD (gastroesophageal reflux disease)    Hyperlipidemia    Hypertension     PAST SURGICAL HISTORY: Past Surgical History:  Procedure Laterality Date   MICRODISCECTOMY LUMBAR  01/2010    FAMILY HISTORY: The patient's family history includes Cancer in his mother and sister; Diabetes in his maternal grandmother and mother; Heart disease in his father; Hyperlipidemia in his father; Stroke in his paternal grandmother.   SOCIAL HISTORY:  The patient  reports that he has quit smoking. His smoking use included cigarettes. He has never used smokeless tobacco. He reports current alcohol use. He reports that he does not use drugs.  Review of Systems  Constitutional: Negative for chills and fever.  HENT:  Negative for hoarse voice and nosebleeds.   Eyes:  Negative for discharge, double vision and pain.  Cardiovascular:  Negative for chest pain, claudication, dyspnea on exertion, leg swelling, near-syncope, orthopnea, palpitations, paroxysmal nocturnal dyspnea and syncope.  Respiratory:  Negative for hemoptysis and shortness of breath.   Musculoskeletal:  Negative  for muscle cramps and myalgias.  Gastrointestinal:  Negative for abdominal pain, constipation, diarrhea, hematemesis, hematochezia, melena, nausea and vomiting.   Neurological:  Negative for dizziness and light-headedness.  All other systems reviewed and are negative.  PHYSICAL EXAM: Vitals with BMI 03/27/2021 08/26/2020 04/15/2020  Height 5' 11" 5' 11" -  Weight 213 lbs 13 oz 208 lbs -  BMI 33.35 45.62 -  Systolic 563 893 734  Diastolic 86 82 82  Pulse 86 73 76    CONSTITUTIONAL: Well-developed and well-nourished. No acute distress.  SKIN: Skin is warm and dry. No rash noted. No cyanosis. No pallor. No jaundice HEAD: Normocephalic and atraumatic.  EYES: No scleral icterus MOUTH/THROAT: Moist oral membranes.  NECK: No JVD present. No thyromegaly noted. No carotid bruits  LYMPHATIC: No visible cervical adenopathy.  CHEST Normal respiratory effort. No intercostal retractions  LUNGS: Clear to auscultation bilaterally. No stridor. No wheezes. No rales.  CARDIOVASCULAR: Regular rate and rhythm, positive S1-S2, no murmurs rubs or gallops appreciated. ABDOMINAL: Soft, nontender, nondistended, positive bowel sounds in all 4 quadrants, no apparent ascites.  EXTREMITIES: Warm to touch, 2+ DP and PT pulses, no peripheral edema  HEMATOLOGIC: No significant bruising NEUROLOGIC: Oriented to person, place, and time. Nonfocal. Normal muscle tone.  PSYCHIATRIC: Normal mood and affect. Normal behavior. Cooperative  RADIOLOGY: Coronary CTA 01/10/2019:  1. Coronary calcium score of 3. This was 6 percentile for age and sex matched control. 2. Normal coronary origin with right dominance. 3. CAD-RADS 1. Minimal non-obstructive CAD (0-24%). Consider non-atherosclerotic causes of chest pain. Consider preventive therapy and risk factor modification.  CARDIAC DATABASE: EKG: 03/27/2021: Normal sinus rhythm, 83 bpm, normal axis, without underlying ischemia or injury pattern.  Echocardiogram: 11/03/2018: Left ventricle cavity is normal in size. Normal left ventricular wall thickness. Normal LV systolic function with EF 55%. Normal global wall motion. Normal  diastolic filling pattern. Mild (Grade I) mitral regurgitation. Mild tricuspid regurgitation. Estimated pulmonary artery systolic pressure is 27 mmHg.    Stress Testing:  Lexiscan nuclear stress test 07/13/2018: The left ventricular ejection fraction is normal (55-65%). Nuclear stress EF: 55%. No T wave inversion was noted during stress. There was no ST segment deviation noted during stress. This is a low risk study.  Heart Catheterization: None  LABORATORY DATA: External Labs: Collected: 04/03/2020 Creatinine 0.92 mg/dL. eGFR: >60 mL/min per 1.73 m Hemoglobin 17 g/dL, hematocrit 49.3% AST 16, ALT 15, alkaline phosphatase 57 Lipid profile: Total cholesterol 273, triglycerides 142, HDL 75, LDL 173  Lipid profile: Collected: 08/22/2020 Total cholesterol 263, triglycerides 68, HDL 82, direct LDL 155, non-HDL 170.  IMPRESSION:    ICD-10-CM   1. Coronary artery calcification of native artery  I25.10 EKG 12-Lead   I25.84 Lipid Panel With LDL/HDL Ratio    LDL cholesterol, direct    CMP14+EGFR    2. Essential hypertension  I10     3. Pure hypercholesterolemia  E78.00     4. Former smoker  Z87.891     5. Family history of early CAD  Z82.49        RECOMMENDATIONS: Steve Bennett is a 46 y.o. male whose past medical history and cardiovascular risk factors include: Family history of premature CAD, GERD, mild coronary artery calcification, former smoker, hypertension.  Coronary artery calcification of native artery Total CAC 3, 78th percentile (12/2018) prior to that his total calcium score was 0. Family history of premature CAD-father had a myocardial infarction in his 70s. Fasting lipid profile notes hypercholesterolemia with the most recent  LDL being 177 mg/dL Estimated 10-year risk of ASCVD less than 5%. Recommended low intensity statin therapy; however, patient is reluctant and would like to continue lifestyle changes. Recommended fiber supplement such as Metamucil  or considering red yeast rice. Will repeat fasting lipid profile in 6 months.  Essential hypertension Home and office blood pressures are within acceptable range. Discontinue Toprol-XL in the past due to decreased libido. Reemphasized the importance of a low-salt diet.  Pure hypercholesterolemia His LDL levels are not well controlled with LDL levels ranging approximately between 150-177 mg/dL. As noted above given his mild CAC placing him at the 78th percentile, family history of premature CAD, estimated 10-year risk of ASCVD less than 5%, decision to start statin therapy needs to be shared given his young age. For now he would like to continue with lifestyle changes as discussed above. Patient is well aware that uncontrolled/untreated hyperlipidemia can lead to cardiovascular morbidity & mortality.  FINAL MEDICATION LIST END OF ENCOUNTER: No orders of the defined types were placed in this encounter.    Medications Discontinued During This Encounter  Medication Reason   atorvastatin (LIPITOR) 20 MG tablet      Current Outpatient Medications:    aspirin EC 81 MG tablet, Take 81 mg by mouth daily. Swallow whole., Disp: , Rfl:    omeprazole (PRILOSEC) 40 MG capsule, TAKE 1 CAPSULE BY MOUTH TWICE A DAY (Patient taking differently: Take 40 mg by mouth 2 (two) times daily.), Disp: 60 capsule, Rfl: 0   valsartan (DIOVAN) 320 MG tablet, Take 1 tablet (320 mg total) by mouth daily., Disp: 90 tablet, Rfl: 1  Orders Placed This Encounter  Procedures   Lipid Panel With LDL/HDL Ratio   LDL cholesterol, direct   CMP14+EGFR   EKG 12-Lead   --Continue cardiac medications as reconciled in final medication list. --Return in about 6 months (around 09/24/2021) for Follow up, Lipid. Or sooner if needed. --Continue follow-up with your primary care physician regarding the management of your other chronic comorbid conditions.  Patient's questions and concerns were addressed to his satisfaction. He  voices understanding of the instructions provided during this encounter.   This note was created using a voice recognition software as a result there may be grammatical errors inadvertently enclosed that do not reflect the nature of this encounter. Every attempt is made to correct such errors.  Rex Kras, [place], [facility]  Pager: [phone] Office: [phone]

## 2021-04-04 ENCOUNTER — Other Ambulatory Visit: Payer: Self-pay

## 2021-04-04 DIAGNOSIS — I2510 Atherosclerotic heart disease of native coronary artery without angina pectoris: Secondary | ICD-10-CM

## 2021-04-07 ENCOUNTER — Other Ambulatory Visit: Payer: Self-pay

## 2021-04-07 ENCOUNTER — Telehealth: Payer: Self-pay | Admitting: Cardiology

## 2021-04-07 DIAGNOSIS — I10 Essential (primary) hypertension: Secondary | ICD-10-CM

## 2021-04-07 MED ORDER — VALSARTAN 320 MG PO TABS: 320.0000 mg | ORAL_TABLET | Freq: Every day | ORAL | 1 refills | Status: DC

## 2021-04-07 NOTE — Telephone Encounter (Signed)
Pt requesting refill for valsartan. Says expired, but he has f/u appt in July with Dr. Terri Skains and he is almost out of meds.

## 2021-04-09 DIAGNOSIS — Z0000 Encounter for general adult medical examination without abnormal findings: Secondary | ICD-10-CM | POA: Diagnosis not present

## 2021-09-26 DIAGNOSIS — I2584 Coronary atherosclerosis due to calcified coronary lesion: Secondary | ICD-10-CM | POA: Diagnosis not present

## 2021-09-26 DIAGNOSIS — I2510 Atherosclerotic heart disease of native coronary artery without angina pectoris: Secondary | ICD-10-CM | POA: Diagnosis not present

## 2021-09-27 LAB — CMP14+EGFR
ALT: 22 IU/L (ref 0–44)
AST: 22 IU/L (ref 0–40)
Albumin/Globulin Ratio: 1.7 (ref 1.2–2.2)
Albumin: 4.7 g/dL (ref 4.1–5.1)
Alkaline Phosphatase: 73 IU/L (ref 44–121)
BUN/Creatinine Ratio: 16 (ref 9–20)
BUN: 16 mg/dL (ref 6–24)
Bilirubin Total: 0.6 mg/dL (ref 0.0–1.2)
CO2: 24 mmol/L (ref 20–29)
Calcium: 10.1 mg/dL (ref 8.7–10.2)
Chloride: 101 mmol/L (ref 96–106)
Creatinine, Ser: 1.03 mg/dL (ref 0.76–1.27)
Globulin, Total: 2.7 g/dL (ref 1.5–4.5)
Glucose: 100 mg/dL — ABNORMAL HIGH (ref 70–99)
Potassium: 4.3 mmol/L (ref 3.5–5.2)
Sodium: 141 mmol/L (ref 134–144)
Total Protein: 7.4 g/dL (ref 6.0–8.5)
eGFR: 91 mL/min/{1.73_m2} (ref >59)

## 2021-09-27 LAB — LIPID PANEL WITH LDL/HDL RATIO
Cholesterol, Total: 213 mg/dL — ABNORMAL HIGH (ref 100–199)
HDL: 75 mg/dL (ref >39)
LDL Chol Calc (NIH): 124 mg/dL — ABNORMAL HIGH (ref 0–99)
LDL/HDL Ratio: 1.7 ratio (ref 0.0–3.6)
Triglycerides: 81 mg/dL (ref 0–149)
VLDL Cholesterol Cal: 14 mg/dL (ref 5–40)

## 2021-09-27 LAB — LDL CHOLESTEROL, DIRECT: LDL Direct: 132 mg/dL — ABNORMAL HIGH (ref 0–99)

## 2021-09-30 ENCOUNTER — Encounter: Payer: Self-pay | Admitting: Cardiology

## 2021-09-30 ENCOUNTER — Ambulatory Visit: Payer: BC Managed Care – PPO | Admitting: Cardiology

## 2021-09-30 VITALS — BP 133/85 | HR 64 | Temp 98.1°F | Resp 16 | Ht 71.0 in | Wt 207.0 lb

## 2021-09-30 DIAGNOSIS — Z87891 Personal history of nicotine dependence: Secondary | ICD-10-CM

## 2021-09-30 DIAGNOSIS — I2510 Atherosclerotic heart disease of native coronary artery without angina pectoris: Secondary | ICD-10-CM | POA: Diagnosis not present

## 2021-09-30 DIAGNOSIS — I10 Essential (primary) hypertension: Secondary | ICD-10-CM

## 2021-09-30 DIAGNOSIS — E7800 Pure hypercholesterolemia, unspecified: Secondary | ICD-10-CM

## 2021-09-30 NOTE — Progress Notes (Signed)
Steve Bennett Date of Birth: [birth year] MRN: [id number] Primary Care Provider:Ross, Dwyane Luo, MD Former Cardiology Providers: Jeri Lager, APRN, FNP-C Primary Cardiologist: Rex Kras, DO, [facility] (established care 02/01/2020)  Date: 09/30/21 Last Office Visit: March 27, 2021   Chief Complaint  Patient presents with   Hyperlipidemia   Follow-up    HPI  Steve Bennett is a 46 y.o.  male whose past medical history and cardiovascular risk factors include: Family history of premature CAD, GERD, mild coronary artery calcification, hyperlipidemia, former smoker, hypertension.  Patient being followed by the practice for coronary artery calcification, hyperlipidemia, and other cardiovascular risk factors.  Given his family history of premature CVD he is undergone a coronary calcium score in the past and his total CAC is 3; however, placing him at the 78th percentile.  His last LDL was 177 mg/dL.  We have discussed pharmacological therapy.  However, patient remains reluctant and wanted to focus on lifestyle changes.  He now presents for 65-monthfollow-up visit.    Over the last 6 months patient has been exercising regularly.  Recently hiked 40 miles in a matter of 3 days.  Recent labs noted direct LDL 132 mg/dL.  He has implemented lifestyle changes in the last office visit.  He denies angina pectoris or heart failure symptoms.  Blood pressures are well controlled but not at goal.  He was on valsartan 320 mg p.o. daily but currently takes half a tablet.  Family history of heart disease with father having an MI in his 544s  FUNCTIONAL STATUS: Continues to physically active he continues to run and hikes.   ALLERGIES: No Known Allergies  MEDICATION LIST PRIOR TO VISIT: Current Outpatient Medications on File Prior to Visit  Medication Sig Dispense Refill   aspirin EC 81 MG tablet Take 81 mg by mouth daily. Swallow whole.     famotidine (PEPCID) 20 MG tablet Take 20  mg by mouth as needed for heartburn or indigestion.     omeprazole (PRILOSEC) 40 MG capsule TAKE 1 CAPSULE BY MOUTH TWICE A DAY (Patient taking differently: Take 40 mg by mouth as needed.) 60 capsule 0   valsartan (DIOVAN) 320 MG tablet Take 1 tablet (320 mg total) by mouth daily. 90 tablet 1   No current facility-administered medications on file prior to visit.    PAST MEDICAL HISTORY: Past Medical History:  Diagnosis Date   Coronary artery calcification of native artery    GERD (gastroesophageal reflux disease)    Hyperlipidemia    Hypertension     PAST SURGICAL HISTORY: Past Surgical History:  Procedure Laterality Date   MICRODISCECTOMY LUMBAR  01/2010    FAMILY HISTORY: The patient's family history includes Cancer in his mother and sister; Diabetes in his maternal grandmother and mother; Heart disease in his father; Hyperlipidemia in his father; Stroke in his paternal grandmother.   SOCIAL HISTORY:  The patient  reports that he has quit smoking. His smoking use included cigarettes. He has never used smokeless tobacco. He reports current alcohol use. He reports that he does not use drugs.  Review of Systems  Cardiovascular:  Negative for chest pain, claudication, dyspnea on exertion, irregular heartbeat, leg swelling, near-syncope, orthopnea, palpitations, paroxysmal nocturnal dyspnea and syncope.  Respiratory:  Negative for shortness of breath.   Hematologic/Lymphatic: Negative for bleeding problem.  Musculoskeletal:  Negative for muscle cramps and myalgias.  Neurological:  Negative for dizziness and light-headedness.    PHYSICAL EXAM:    09/30/2021   11:44 AM  03/27/2021    2:54 PM 08/26/2020    3:51 PM  Vitals with BMI  Height _0  _1  _2   Weight 207 lbs 213 lbs 13 oz 208 lbs  BMI 28.88 37.16 96.78  Systolic 938 101 751  Diastolic 85 86 82  Pulse 64 86 73    Physical Exam  Neck: No JVD present.  Cardiovascular: Normal rate, regular rhythm, S1 normal, S2  normal, intact distal pulses and normal pulses. Exam reveals no gallop, no S3 and no S4.  No murmur heard. Pulmonary/Chest: Effort normal and breath sounds normal. No stridor.  Abdominal: Soft. Bowel sounds are normal. He exhibits no distension. There is no abdominal tenderness.  Musculoskeletal:        General: No edema.    RADIOLOGY: Coronary CTA 01/10/2019:  1. Coronary calcium score of 3. This was 43 percentile for age and sex matched control. 2. Normal coronary origin with right dominance. 3. CAD-RADS 1. Minimal non-obstructive CAD (0-24%). Consider non-atherosclerotic causes of chest pain. Consider preventive therapy and risk factor modification.  CARDIAC DATABASE: EKG: 09/30/2021: Normal sinus rhythm, 64 bpm, without underlying ischemia injury pattern.  Echocardiogram: 11/03/2018: Left ventricle cavity is normal in size. Normal left ventricular wall thickness. Normal LV systolic function with EF 55%. Normal global wall motion. Normal diastolic filling pattern. Mild (Grade I) mitral regurgitation. Mild tricuspid regurgitation. Estimated pulmonary artery systolic pressure is 27 mmHg.    Stress Testing:  Lexiscan nuclear stress test 07/13/2018: The left ventricular ejection fraction is normal (55-65%). Nuclear stress EF: 55%. No T wave inversion was noted during stress. There was no ST segment deviation noted during stress. This is a low risk study.  Heart Catheterization: None  LABORATORY DATA: External Labs: Collected: 04/03/2020 Creatinine 0.92 mg/dL. eGFR: >60 mL/min per 1.73 m Hemoglobin 17 g/dL, hematocrit 49.3% AST 16, ALT 15, alkaline phosphatase 57 Lipid profile: Total cholesterol 273, triglycerides 142, HDL 75, LDL 173  Lipid profile: Collected: 08/22/2020 Total cholesterol 263, triglycerides 68, HDL 82, direct LDL 155, non-HDL 170.  LABORATORY DATA:    Latest Ref Rng & Units 08/31/2016    5:28 PM 05/13/2015    9:31 AM  CBC  WBC 4.0 - 10.5 K/uL 6.0  5.8    Hemoglobin 13.0 - 17.0 g/dL 15.0  15.7   Hematocrit 39.0 - 52.0 % 41.8  46.2   Platelets 150 - 400 K/uL 291  358.0        Latest Ref Rng & Units 09/26/2021    8:09 AM 03/25/2021    7:35 AM 09/17/2020    3:54 PM  CMP  Glucose 70 - 99 mg/dL 100  106  95   BUN 6 - 24 mg/dL _3 Creatinine 0.76 - 1.27 mg/dL 1.03  1.08  0.88   Sodium 134 - 144 mmol/L 141  139  137   Potassium 3.5 - 5.2 mmol/L 4.3  4.5  4.6   Chloride 96 - 106 mmol/L 101  101  100   CO2 20 - 29 mmol/L _4 Calcium 8.7 - 10.2 mg/dL 10.1  9.9  9.5   Total Protein 6.0 - 8.5 g/dL 7.4  7.5    Total Bilirubin 0.0 - 1.2 mg/dL 0.6  0.7    Alkaline Phos 44 - 121 IU/L 73  71    AST 0 - 40 IU/L 22  73    ALT 0 - 44 IU/L 22  42  Lipid Panel     Component Value Date/Time   CHOL 213 (H) 09/26/2021 0810   TRIG 81 09/26/2021 0810   HDL 75 09/26/2021 0810   LDLCALC 124 (H) 09/26/2021 0810   LDLDIRECT 132 (H) 09/26/2021 0808   LABVLDL 14 09/26/2021 0810    No components found for: "NTPROBNP" No results for input(s): "PROBNP" in the last 8760 hours. No results for input(s): "TSH" in the last 8760 hours.  BMP Recent Labs    03/25/21 0735 09/26/21 0809  NA 139 141  K 4.5 4.3  CL 101 101  CO2 26 24  GLUCOSE 106* 100*  BUN 17 16  CREATININE 1.08 1.03  CALCIUM 9.9 10.1    HEMOGLOBIN A1C No results found for: "HGBA1C", "MPG"  Cardiac Panel (last 3 results) No results for input(s): "CKTOTAL", "CKMB", "TROPONINIHS", "RELINDX" in the last 72 hours.  CHOLESTEROL Recent Labs    03/25/21 0735 09/26/21 0810  CHOL 271* 213*    Hepatic Function Panel Recent Labs    03/25/21 0735 09/26/21 0809  PROT 7.5 7.4  ALBUMIN 4.7 4.7  AST 73* 22  ALT 42 22  ALKPHOS 71 73  BILITOT 0.7 0.6     IMPRESSION:    ICD-10-CM   1. Coronary artery calcification of native artery  I25.10 EKG 12-Lead   I25.84 Lipid Panel With LDL/HDL Ratio    LDL cholesterol, direct    CMP14+EGFR    2. Essential  hypertension  I10     3. Pure hypercholesterolemia  E78.00 Lipid Panel With LDL/HDL Ratio    LDL cholesterol, direct    CMP14+EGFR    4. Former smoker  Z87.891        RECOMMENDATIONS: Steve Bennett is a 46 y.o. male whose past medical history and cardiovascular risk factors include: Family history of premature CAD, GERD, mild coronary artery calcification, former smoker, hypertension.  Coronary artery calcification of native artery Total CAC 3AU, 70th percentile (12/2018) corrected that his total calcium score was 0. Family history of premature CAD-father had his first MI at the age of 40. In the past his direct LDL 177 mg/dL was recommended statin therapy but this shared decision was to implement lifestyle changes.  Since then he has increased his physical activity, reduced consumption of  high cholesterol foods, and his recent LDL is 132 mg/dL.  His estimated 10-year risk of ASCVD is 1.6%.  Patient still remains reluctant to be on pharmacological therapy.   Recommended a diet high in fiber and considering red yeast rice.  Repeat fasting lipid profile in 6 months and I will see him in January 2024.  Essential hypertension Office blood pressures within acceptable range.  Currently takes valsartan 320 mg half a tablet per day.  Advised him to take a full tablet.  We emphasized the importance of a low-salt diet.  Pure hypercholesterolemia Improving. In the past his LDL levels have ranged between 150-177 mg/dL.  Most recent lipid profile notes a direct LDL of 132 mg/dL.  We will continue lifestyle changes and reevaluate lipids in 6 months. Encouraged increasing fiber in the diet, reducing lipid rich foods, and red yeast rice as alternatives.  Monitor for now.  Outside labs from 09/26/2021 independently reviewed as part of today's office visit and summarized above for further reference.  FINAL MEDICATION LIST END OF ENCOUNTER: No orders of the defined types were placed in  this encounter.    There are no discontinued medications.    Current Outpatient Medications:  aspirin EC 81 MG tablet, Take 81 mg by mouth daily. Swallow whole., Disp: , Rfl:    famotidine (PEPCID) 20 MG tablet, Take 20 mg by mouth as needed for heartburn or indigestion., Disp: , Rfl:    omeprazole (PRILOSEC) 40 MG capsule, TAKE 1 CAPSULE BY MOUTH TWICE A DAY (Patient taking differently: Take 40 mg by mouth as needed.), Disp: 60 capsule, Rfl: 0   valsartan (DIOVAN) 320 MG tablet, Take 1 tablet (320 mg total) by mouth daily., Disp: 90 tablet, Rfl: 1  Orders Placed This Encounter  Procedures   Lipid Panel With LDL/HDL Ratio   LDL cholesterol, direct   CMP14+EGFR   EKG 12-Lead   --Continue cardiac medications as reconciled in final medication list. --Return in about 6 months (around 04/15/2022) for Follow up, Coronary artery calcification, Lipid. Or sooner if needed. --Continue follow-up with your primary care physician regarding the management of your other chronic comorbid conditions.  Patient's questions and concerns were addressed to his satisfaction. He voices understanding of the instructions provided during this encounter.   This note was created using a voice recognition software as a result there may be grammatical errors inadvertently enclosed that do not reflect the nature of this encounter. Every attempt is made to correct such errors.  Rex Kras, [place], [facility]  Pager: [phone] Office: [phone]

## 2021-10-01 ENCOUNTER — Ambulatory Visit: Payer: BC Managed Care – PPO | Admitting: Gastroenterology

## 2021-10-01 ENCOUNTER — Encounter: Payer: Self-pay | Admitting: Gastroenterology

## 2021-10-01 VITALS — Ht 71.0 in | Wt 207.0 lb

## 2021-10-01 DIAGNOSIS — K219 Gastro-esophageal reflux disease without esophagitis: Secondary | ICD-10-CM | POA: Diagnosis not present

## 2021-10-01 MED ORDER — OMEPRAZOLE 20 MG PO CPDR: 20.0000 mg | DELAYED_RELEASE_CAPSULE | Freq: Every day | ORAL | 3 refills | Status: DC

## 2021-10-01 MED ORDER — FAMOTIDINE 20 MG PO TABS: 20.0000 mg | ORAL_TABLET | Freq: Every day | ORAL | Status: AC

## 2021-10-01 NOTE — Patient Instructions (Addendum)
If you are age 47 or younger, your body mass index should be between 19-25. Your Body mass index is 28.87 kg/m. If this is out of the aformentioned range listed, please consider follow up with your Primary Care Provider.  ________________________________________________________  The [facility] providers would like to encourage you to use [facility] to communicate with providers for non-urgent requests or questions.  Due to long hold times on the telephone, sending your provider a message by [facility] may be a faster and more efficient way to get a response.  Please allow 48 business hours for a response.  Please remember that this is for non-urgent requests.  _______________________________________________________  Omeprazole '20mg'$  one capsule 30 minutes prior to breakfast meal each day.  Famotidine '20mg'$  one tablet every night at bedtime.  Please send MyChart message in 4 weeks to report on how you are feeling.  Thank you for entrusting me with your care and choosing [facility].  Dr Ardis Hughs

## 2021-10-01 NOTE — Progress Notes (Signed)
Thank review of pertinent gastrointestinal problems: 1.  2017 EGD for heartburn with LA grade B reflux esophagitis, small hiatal hernia, gastritis and otherwise normal.  Patient was started on omeprazole 40 mg daily.  Pathology showed chronic inactive gastritis.   2.  Adenomatous colon polyp: colonoscopy 2017 for change in bowel habits with a single 3 mm TA.  Recall in 2024     HPI: This is a very pleasant 46 year old man who was last here in the office about 3 years ago when he was having some atypical chest pains.  His cardiologist feels these are "noncardiac" in origin  Complete metabolic profile 05/2353 was normal  His weight is up 7 pounds since his last office visit here about 3 years ago, same scale in our office  He has chronic GERD without alarm symptoms.  He describes heartburn.  No dysphagia, his weight is up, he has no overt GI bleeding.  Tries to maintain a quite of a healthy diet and exercise regularly.  He is concerned about long-term side effects of antiacid medicines.  Currently he takes omeprazole 20 mg twice daily alternating with famotidine 20 mg twice daily.  On this regimen he rarely has breakthrough   ROS: complete GI ROS as described in HPI, all other review negative.  Constitutional:  No unintentional weight loss   Past Medical History:  Diagnosis Date   Coronary artery calcification of native artery    GERD (gastroesophageal reflux disease)    Hyperlipidemia    Hypertension     Past Surgical History:  Procedure Laterality Date   MICRODISCECTOMY LUMBAR  01/2010    Current Outpatient Medications  Medication Instructions   aspirin EC 81 mg, Oral, Daily, Swallow whole.   famotidine (PEPCID) 20 mg, Oral, As needed   omeprazole (PRILOSEC) 40 MG capsule TAKE 1 CAPSULE BY MOUTH TWICE A DAY   valsartan (DIOVAN) 320 mg, Oral, Daily    Allergies as of 10/01/2021   (No Known Allergies)    Family History  Problem Relation Age of Onset   Cancer Mother     Diabetes Mother    Heart disease Father    Hyperlipidemia Father    Cancer Sister    Diabetes Maternal Grandmother    Stroke Paternal Grandmother     Social History   Socioeconomic History   Marital status: Single    Spouse name: Not on file   Number of children: 0   Years of education: Not on file   Highest education level: Not on file  Occupational History   Not on file  Tobacco Use   Smoking status: Former    Types: Cigarettes   Smokeless tobacco: Never   Tobacco comments:    smoked for a few years in college   Vaping Use   Vaping Use: Never used  Substance and Sexual Activity   Alcohol use: Yes    Alcohol/week: 0.0 standard drinks of alcohol    Comment: social use   Drug use: No   Sexual activity: Not Currently  Other Topics Concern   Not on file  Social History Narrative   Not on file   Social Determinants of Health   Financial Resource Strain: Not on file  Food Insecurity: Not on file  Transportation Needs: Not on file  Physical Activity: Not on file  Stress: Not on file  Social Connections: Not on file  Intimate Partner Violence: Not on file     Physical Exam: Ht '5\' 11"'$  (1.803 m)   Wt  207 lb (93.9 kg)   BMI 28.87 kg/m  Constitutional: generally well-appearing Psychiatric: alert and oriented x3 Abdomen: soft, nontender, nondistended, no obvious ascites, no peritoneal signs, normal bowel sounds No peripheral edema noted in lower extremities  Assessment and plan: 46 y.o. male with GERD without alarm symptoms, history of precancerous colon polyp  Explained to him the guidelines changed and that he does not need a surveillance colonoscopy until 2024 given his single subcentimeter tubular adenoma.  We nice discussion about the pathophysiology of GERD, GERD related damage to the esophagus.  He has no alarm symptoms.  I recommended he try dosing his antiacid medicines with omeprazole 20 mg pills 1 pill shortly before his first meal the day and famotidine  20 mg 1 pill at bedtime every night.  He will give that about 2 weeks and then he might try taking the omeprazole shortly before his lunch meal because that is a bigger meal for him usually.  He will MyChart me with the way he is feeling in about 4 weeks.  Please see the "Patient Instructions" section for addition details about the plan.  Owens Loffler, MD [facility] 10/01/2021, 2:15 PM   Total time on date of encounter was 20 minutes (this included time spent preparing to see the patient reviewing records; obtaining and/or reviewing separately obtained history; performing a medically appropriate exam and/or evaluation; counseling and educating the patient and family if present; ordering medications, tests or procedures if applicable; and documenting clinical information in the health record).

## 2021-11-26 ENCOUNTER — Other Ambulatory Visit: Payer: Self-pay | Admitting: Cardiology

## 2021-11-26 DIAGNOSIS — I10 Essential (primary) hypertension: Secondary | ICD-10-CM

## 2022-02-18 ENCOUNTER — Other Ambulatory Visit: Payer: Self-pay | Admitting: Gastroenterology

## 2022-02-18 NOTE — Telephone Encounter (Signed)
Last seen in the GI clinic by Dr. Ardis Hughs on 10/01/2021.  History of GERD with erosive esophagitis, small hiatal hernia.  Per notes, reflux well controlled with omeprazole 20 mg twice daily alternating with famotidine 20 mg twice daily, and modified at that appointment to omeprazole 20 mg prebreakfast and Pepcid 20 mg at bedtime.  Looks like he did well with that modification.  Ok to refill omeprazole 20 mg daily, #90, RF 5.  Follow-up in the GI clinic in 09/2023 unless new or acute issues requiring sooner appointment.

## 2022-02-18 NOTE — Telephone Encounter (Signed)
Good morning Dr Cirigliano,  This is a patient of Dr Jacobs. I am sending you this refill request as you are DOD am.  Please advise refills.  

## 2022-04-02 ENCOUNTER — Ambulatory Visit: Payer: BC Managed Care – PPO | Admitting: Cardiology

## 2022-04-14 DIAGNOSIS — Z0000 Encounter for general adult medical examination without abnormal findings: Secondary | ICD-10-CM | POA: Diagnosis not present

## 2022-04-20 ENCOUNTER — Encounter: Payer: Self-pay | Admitting: Cardiology

## 2022-04-20 ENCOUNTER — Ambulatory Visit: Payer: BC Managed Care – PPO | Admitting: Cardiology

## 2022-04-20 VITALS — BP 139/82 | HR 70 | Resp 17 | Ht 71.0 in | Wt 215.0 lb

## 2022-04-20 DIAGNOSIS — I2584 Coronary atherosclerosis due to calcified coronary lesion: Secondary | ICD-10-CM | POA: Diagnosis not present

## 2022-04-20 DIAGNOSIS — I10 Essential (primary) hypertension: Secondary | ICD-10-CM

## 2022-04-20 DIAGNOSIS — I2510 Atherosclerotic heart disease of native coronary artery without angina pectoris: Secondary | ICD-10-CM | POA: Diagnosis not present

## 2022-04-20 DIAGNOSIS — Z87891 Personal history of nicotine dependence: Secondary | ICD-10-CM

## 2022-04-20 DIAGNOSIS — E7800 Pure hypercholesterolemia, unspecified: Secondary | ICD-10-CM

## 2022-04-20 DIAGNOSIS — Z8249 Family history of ischemic heart disease and other diseases of the circulatory system: Secondary | ICD-10-CM

## 2022-04-20 NOTE — Progress Notes (Signed)
Steve Bennett Date of Birth: 03-Jun-1975 MRN: [id number] Primary Care Provider:Ross, Dwyane Luo, MD Former Cardiology Providers: Jeri Lager, APRN, FNP-C Primary Cardiologist: Rex Kras, DO, [facility] (established care 02/01/2020)  Date: 04/20/22 Last Office Visit: 09/30/2021  Chief Complaint  Patient presents with   Follow-up    6 month -coronary artery calcification, hyperlipidemia    HPI  Steve Bennett is a 47 y.o.  male whose past medical history and cardiovascular risk factors include: Family history of premature CAD, GERD, mild coronary artery calcification, hyperlipidemia, former smoker, hypertension.  Patient being followed by the practice for coronary artery calcification, hyperlipidemia, and other cardiovascular risk factors.  Given his family history of premature CVD he is undergone a coronary calcium score in the past and his total CAC is 3; however, placing him at the 78th percentile.  His LDL in the past has been as high as 177 mg/dL.  But has been reluctant to be on pharmacological therapy.  He is implemented lifestyle changes and LDL levels have improved to 132 mg/dL.  He presents today for 33-monthfollow-up visit.  Denies anginal discomfort or heart failure symptoms.  No recent labs available for review.  Family history of heart disease with father having an MI in his 592s  FUNCTIONAL STATUS: Continues to physically active he continues to run and hikes.   ALLERGIES: No Known Allergies  MEDICATION LIST PRIOR TO VISIT: Current Outpatient Medications on File Prior to Visit  Medication Sig Dispense Refill   aspirin EC 81 MG tablet Take 81 mg by mouth daily. Swallow whole.     famotidine (PEPCID) 20 MG tablet Take 1 tablet (20 mg total) by mouth at bedtime.     omeprazole (PRILOSEC) 20 MG capsule TAKE 1 CAPSULE BY MOUTH EVERY DAY 90 capsule 5   valsartan (DIOVAN) 320 MG tablet TAKE 1 TABLET BY MOUTH EVERY DAY 30 tablet 5   No current  facility-administered medications on file prior to visit.    PAST MEDICAL HISTORY: Past Medical History:  Diagnosis Date   Coronary artery calcification of native artery    GERD (gastroesophageal reflux disease)    Hyperlipidemia    Hypertension     PAST SURGICAL HISTORY: Past Surgical History:  Procedure Laterality Date   MICRODISCECTOMY LUMBAR  01/2010    FAMILY HISTORY: The patient's family history includes Cancer in his mother and sister; Diabetes in his maternal grandmother and mother; Heart disease in his father; Hyperlipidemia in his father; Stroke in his paternal grandmother.   SOCIAL HISTORY:  The patient  reports that he quit smoking about 29 years ago. His smoking use included cigarettes. He has a 1.50 pack-year smoking history. He has never used smokeless tobacco. He reports current alcohol use. He reports that he does not use drugs.  Review of Systems  Cardiovascular:  Negative for chest pain, claudication, dyspnea on exertion, irregular heartbeat, leg swelling, near-syncope, orthopnea, palpitations, paroxysmal nocturnal dyspnea and syncope.  Respiratory:  Negative for shortness of breath.   Hematologic/Lymphatic: Negative for bleeding problem.  Musculoskeletal:  Negative for muscle cramps and myalgias.  Neurological:  Negative for dizziness and light-headedness.    PHYSICAL EXAM:    04/20/2022   10:33 AM 10/01/2021    2:10 PM 09/30/2021   11:44 AM  Vitals with BMI  Height '5\' 11"'$  '5\' 11"'$  '5\' 11"'$   Weight 215 lbs 207 lbs 207 lbs  BMI 30 [phone] Systolic 1947 1654 Diastolic 82  85  Pulse 70  64  Physical Exam  Neck: No JVD present.  Cardiovascular: Normal rate, regular rhythm, S1 normal, S2 normal, intact distal pulses and normal pulses. Exam reveals no gallop, no S3 and no S4.  No murmur heard. Pulmonary/Chest: Effort normal and breath sounds normal. No stridor.  Abdominal: Soft. Bowel sounds are normal. He exhibits no distension. There is no abdominal  tenderness.  Musculoskeletal:        General: No edema.    RADIOLOGY: Coronary CTA 01/10/2019:  1. Coronary calcium score of 3. This was 43 percentile for age and sex matched control. 2. Normal coronary origin with right dominance. 3. CAD-RADS 1. Minimal non-obstructive CAD (0-24%). Consider non-atherosclerotic causes of chest pain. Consider preventive therapy and risk factor modification.  CARDIAC DATABASE: EKG: 04/20/2022: Sinus rhythm, 60 bpm, without underlying ischemia or injury pattern.  Echocardiogram: 11/03/2018: Left ventricle cavity is normal in size. Normal left ventricular wall thickness. Normal LV systolic function with EF 55%. Normal global wall motion. Normal diastolic filling pattern. Mild (Grade I) mitral regurgitation. Mild tricuspid regurgitation. Estimated pulmonary artery systolic pressure is 27 mmHg.    Stress Testing:  Lexiscan nuclear stress test 07/13/2018: The left ventricular ejection fraction is normal (55-65%). Nuclear stress EF: 55%. No T wave inversion was noted during stress. There was no ST segment deviation noted during stress. This is a low risk study.  Heart Catheterization: None  LABORATORY DATA: External Labs: Collected: 04/03/2020 Creatinine 0.92 mg/dL. eGFR: >60 mL/min per 1.73 m Hemoglobin 17 g/dL, hematocrit 49.3% AST 16, ALT 15, alkaline phosphatase 57 Lipid profile: Total cholesterol 273, triglycerides 142, HDL 75, LDL 173  Lipid profile: Collected: 08/22/2020 Total cholesterol 263, triglycerides 68, HDL 82, direct LDL 155, non-HDL 170.  LABORATORY DATA:    Latest Ref Rng & Units 08/31/2016    5:28 PM 05/13/2015    9:31 AM  CBC  WBC 4.0 - 10.5 K/uL 6.0  5.8   Hemoglobin 13.0 - 17.0 g/dL 15.0  15.7   Hematocrit 39.0 - 52.0 % 41.8  46.2   Platelets 150 - 400 K/uL 291  358.0        Latest Ref Rng & Units 09/26/2021    8:09 AM 03/25/2021    7:35 AM 09/17/2020    3:54 PM  CMP  Glucose 70 - 99 mg/dL 100  106  95   BUN 6 - 24  mg/dL '16  17  21   '$ Creatinine 0.76 - 1.27 mg/dL 1.03  1.08  0.88   Sodium 134 - 144 mmol/L 141  139  137   Potassium 3.5 - 5.2 mmol/L 4.3  4.5  4.6   Chloride 96 - 106 mmol/L 101  101  100   CO2 20 - 29 mmol/L '24  26  24   '$ Calcium 8.7 - 10.2 mg/dL 10.1  9.9  9.5   Total Protein 6.0 - 8.5 g/dL 7.4  7.5    Total Bilirubin 0.0 - 1.2 mg/dL 0.6  0.7    Alkaline Phos 44 - 121 IU/L 73  71    AST 0 - 40 IU/L 22  73    ALT 0 - 44 IU/L 22  42      Lipid Panel     Component Value Date/Time   CHOL 213 (H) 09/26/2021 0810   TRIG 81 09/26/2021 0810   HDL 75 09/26/2021 0810   LDLCALC 124 (H) 09/26/2021 0810   LDLDIRECT 132 (H) 09/26/2021 0808   LABVLDL 14 09/26/2021 0810    No components found  for: "NTPROBNP" No results for input(s): "PROBNP" in the last 8760 hours. No results for input(s): "TSH" in the last 8760 hours.  BMP Recent Labs    09/26/21 0809  NA 141  K 4.3  CL 101  CO2 24  GLUCOSE 100*  BUN 16  CREATININE 1.03  CALCIUM 10.1    HEMOGLOBIN A1C No results found for: "HGBA1C", "MPG"  Cardiac Panel (last 3 results) No results for input(s): "CKTOTAL", "CKMB", "TROPONINIHS", "RELINDX" in the last 72 hours.  CHOLESTEROL Recent Labs    09/26/21 0810  CHOL 213*    Hepatic Function Panel Recent Labs    09/26/21 0809  PROT 7.4  ALBUMIN 4.7  AST 22  ALT 22  ALKPHOS 73  BILITOT 0.6    IMPRESSION:    ICD-10-CM   1. Coronary artery calcification of native artery  I25.10 EKG 12-Lead   I25.84 Lipid Panel With LDL/HDL Ratio    LDL cholesterol, direct    CMP14+EGFR    Apo A1 + B + Ratio    Lipoprotein A (LPA)    2. Essential hypertension  I10     3. Pure hypercholesterolemia  E78.00 Lipid Panel With LDL/HDL Ratio    LDL cholesterol, direct    CMP14+EGFR    Apo A1 + B + Ratio    Lipoprotein A (LPA)    4. Former smoker  Z87.891     5. Family history of early CAD  Z82.49 Lipid Panel With LDL/HDL Ratio    LDL cholesterol, direct    CMP14+EGFR    Apo A1  + B + Ratio    Lipoprotein A (LPA)       RECOMMENDATIONS: DAIN LASETER is a 47 y.o. male whose past medical history and cardiovascular risk factors include: Family history of premature CAD, GERD, mild coronary artery calcification, former smoker, hypertension.  Coronary artery calcification of native artery Total CAC 3AU, 70th percentile (12/2018) compared to before it was 0. Family history of premature CAD-father had his first MI at the age of 75. In the past his direct was LDL 177 mg/dL and after implementing lifestyle changes it reduced to 132 mg/dL.   At the last office visit the shared decision was to continue medical management. No recent labs available for review. Shared decision was to repeat labs to reevaluate fasting lipid profile.  If LDLs have not changed drastically he would like to avoid statin therapy is much as possible.  However, if the lipids have up trended significantly he is willing to consider a trial of statin therapy.  Further recommendations to follow.  If no significant change in his lipids and he prefers medical management I will like to see him back on an annual basis.  Essential hypertension Office blood pressures are within acceptable limits but not at goal. I have asked her to check her blood pressures at home to see what his trends are and reviewed the numbers with his PCP. Reemphasized the importance of low-salt diet  Pure hypercholesterolemia Improving. In the past his LDL levels have ranged between 150-177 mg/dL.  Most recent lipid profile notes a direct LDL of 132 mg/dL.  Encouraged increasing fiber in the diet, reducing lipid rich foods, and red yeast rice as alternatives.  Monitor for now.  FINAL MEDICATION LIST END OF ENCOUNTER: No orders of the defined types were placed in this encounter.    There are no discontinued medications.    Current Outpatient Medications:    aspirin EC 81 MG tablet, Take  81 mg by mouth daily. Swallow  whole., Disp: , Rfl:    famotidine (PEPCID) 20 MG tablet, Take 1 tablet (20 mg total) by mouth at bedtime., Disp: , Rfl:    omeprazole (PRILOSEC) 20 MG capsule, TAKE 1 CAPSULE BY MOUTH EVERY DAY, Disp: 90 capsule, Rfl: 5   valsartan (DIOVAN) 320 MG tablet, TAKE 1 TABLET BY MOUTH EVERY DAY, Disp: 30 tablet, Rfl: 5  Orders Placed This Encounter  Procedures   Lipid Panel With LDL/HDL Ratio   LDL cholesterol, direct   CMP14+EGFR   Apo A1 + B + Ratio   Lipoprotein A (LPA)   EKG 12-Lead   --Continue cardiac medications as reconciled in final medication list. --Return in about 1 year (around 04/21/2023) for Follow up, Coronary artery calcification. Or sooner if needed. --Continue follow-up with your primary care physician regarding the management of your other chronic comorbid conditions.  Patient's questions and concerns were addressed to his satisfaction. He voices understanding of the instructions provided during this encounter.   This note was created using a voice recognition software as a result there may be grammatical errors inadvertently enclosed that do not reflect the nature of this encounter. Every attempt is made to correct such errors.  Rex Kras, [place], [facility]  Pager: [phone] Office: [phone]

## 2022-05-30 ENCOUNTER — Other Ambulatory Visit: Payer: Self-pay | Admitting: Cardiology

## 2022-05-30 DIAGNOSIS — I10 Essential (primary) hypertension: Secondary | ICD-10-CM

## 2022-07-17 ENCOUNTER — Encounter: Payer: Self-pay | Admitting: Gastroenterology

## 2022-12-11 ENCOUNTER — Other Ambulatory Visit: Payer: Self-pay | Admitting: Cardiology

## 2022-12-11 DIAGNOSIS — I10 Essential (primary) hypertension: Secondary | ICD-10-CM

## 2023-01-11 DIAGNOSIS — M4722 Other spondylosis with radiculopathy, cervical region: Secondary | ICD-10-CM | POA: Diagnosis not present

## 2023-01-19 DIAGNOSIS — M5013 Cervical disc disorder with radiculopathy, cervicothoracic region: Secondary | ICD-10-CM | POA: Diagnosis not present

## 2023-01-25 DIAGNOSIS — M4722 Other spondylosis with radiculopathy, cervical region: Secondary | ICD-10-CM | POA: Diagnosis not present

## 2023-01-26 DIAGNOSIS — M5013 Cervical disc disorder with radiculopathy, cervicothoracic region: Secondary | ICD-10-CM | POA: Diagnosis not present

## 2023-02-10 DIAGNOSIS — M5013 Cervical disc disorder with radiculopathy, cervicothoracic region: Secondary | ICD-10-CM | POA: Diagnosis not present

## 2023-03-08 ENCOUNTER — Other Ambulatory Visit: Payer: Self-pay | Admitting: Gastroenterology

## 2023-04-07 ENCOUNTER — Other Ambulatory Visit: Payer: Self-pay | Admitting: Gastroenterology

## 2023-04-11 ENCOUNTER — Other Ambulatory Visit: Payer: Self-pay | Admitting: Gastroenterology

## 2023-04-20 ENCOUNTER — Ambulatory Visit: Payer: BC Managed Care – PPO | Admitting: Cardiology

## 2023-04-23 DIAGNOSIS — Z125 Encounter for screening for malignant neoplasm of prostate: Secondary | ICD-10-CM | POA: Diagnosis not present

## 2023-04-23 DIAGNOSIS — Z0000 Encounter for general adult medical examination without abnormal findings: Secondary | ICD-10-CM | POA: Diagnosis not present

## 2023-05-31 ENCOUNTER — Ambulatory Visit: Payer: BC Managed Care – PPO | Attending: Cardiology | Admitting: Cardiology

## 2023-05-31 VITALS — BP 122/78 | HR 85 | Resp 16 | Ht 71.0 in | Wt 209.0 lb

## 2023-05-31 DIAGNOSIS — I2584 Coronary atherosclerosis due to calcified coronary lesion: Secondary | ICD-10-CM | POA: Diagnosis not present

## 2023-05-31 DIAGNOSIS — E7800 Pure hypercholesterolemia, unspecified: Secondary | ICD-10-CM

## 2023-05-31 DIAGNOSIS — Z8249 Family history of ischemic heart disease and other diseases of the circulatory system: Secondary | ICD-10-CM | POA: Diagnosis not present

## 2023-05-31 DIAGNOSIS — I10 Essential (primary) hypertension: Secondary | ICD-10-CM

## 2023-05-31 DIAGNOSIS — I2510 Atherosclerotic heart disease of native coronary artery without angina pectoris: Secondary | ICD-10-CM

## 2023-05-31 NOTE — Patient Instructions (Signed)
 Medication Instructions:  Your physician recommends that you continue on your current medications as directed. Please refer to the Current Medication list given to you today.  *If you need a refill on your cardiac medications before your next appointment, please call your pharmacy*  Lab Work: To be completed tomorrow: FASTING lipid panel and CMP  If you have labs (blood work) drawn today and your tests are completely normal, you will receive your results only by: MyChart Message (if you have MyChart) OR A paper copy in the mail If you have any lab test that is abnormal or we need to change your treatment, we will call you to review the results.  Testing/Procedures: Your physician has requested that you have a coronary calcium score performed. This is not covered by insurance and will be an out-of-pocket cost of approximately $99.   Follow-Up: At [facility], you and your health needs are our priority.  As part of our continuing mission to provide you with exceptional heart care, we have created designated Provider Care Teams.  These Care Teams include your primary Cardiologist (physician) and Advanced Practice Providers (APPs -  Physician Assistants and Nurse Practitioners) who all work together to provide you with the care you need, when you need it.    Your next appointment:   1 year(s)  The format for your next appointment:   In Person  Provider:   Tessa Lerner, DO {  Other Instructions   1st Floor: - Lobby - Registration  - Pharmacy  - Lab - Cafe  2nd Floor: - PV Lab - Diagnostic Testing (echo, CT, nuclear med)  3rd Floor: - Vacant  4th Floor: - TCTS (cardiothoracic surgery) - AFib Clinic - Structural Heart Clinic - Vascular Surgery  - Vascular Ultrasound  5th Floor: - HeartCare Cardiology (general and EP) - Clinical Pharmacy for coumadin, hypertension, lipid, weight-loss medications, and med management appointments    Valet parking services will be  available as well.

## 2023-05-31 NOTE — Progress Notes (Unsigned)
 Cardiology Office Note:  .   Date:  06/01/2023  ID:  FAHEEM ZIEMANN, DOB 05/08/1975, MRN [id number] PCP:  Daisy Floro, MD  Former Cardiology Providers: Altamese [place], APRN, FNP-C  [facility] Providers Cardiologist:  Tessa Lerner, DO , [facility] (established care 02/01/2020) Electrophysiologist:  None  Click to update primary MD,subspecialty MD or APP then REFRESH:1}    Chief Complaint  Patient presents with   Follow-up    1 year follow up - CAC    History of Present Illness: Marland Kitchen   Steve Bennett is a 48 y.o. Caucasian male whose past medical history and cardiovascular risk factors includes: Family history of premature CAD, GERD, mild coronary artery calcification, hyperlipidemia, former smoker, hypertension.   Patient is being followed by the practice given his family history of premature CAD, his coronary calcification/hyperlipidemia and other cardiovascular risk factors.  Patient presents today for 1 year follow-up visit.  Patient noted to have minimal CAC with a total score of 3 however it places him at the 78th percentile at the time of testing.  His LDL in the past was as high as 177 mmHg but he remained reluctant to be on pharmacological therapy.  He implemented lifestyle changes and LDL levels improved to 132 mg/dL.  Patient favored continued surveillance.  Over the last 1 year he denies anginal chest pain or heart failure symptoms.  Overall function capacity is limited over the last couple months due to pinched nerve in his neck.  But is getting back to his regular physical activity.   Father had a myocardial infarction in his 55s.   Review of Systems: .   Review of Systems  Cardiovascular:  Negative for chest pain, claudication, irregular heartbeat, leg swelling, near-syncope, orthopnea, palpitations, paroxysmal nocturnal dyspnea and syncope.  Respiratory:  Negative for shortness of breath.   Hematologic/Lymphatic: Negative for bleeding problem.     Studies Reviewed:   EKG: EKG Interpretation Date/Time:  Monday May 31 2023 15:47:53 EDT Ventricular Rate:  85 PR Interval:  140 QRS Duration:  88 QT Interval:  342 QTC Calculation: 406 R Axis:   43  Text Interpretation: Normal sinus rhythm Normal ECG When compared with ECG of 31-Aug-2016 17:18, No significant change since last tracing Confirmed by Tessa Lerner [phone]) on 05/31/2023 3:54:04 PM  Echocardiogram: 11/03/2018: Left ventricle cavity is normal in size. Normal left ventricular wall thickness. Normal LV systolic function with EF 55%. Normal global wall motion. Normal diastolic filling pattern. Mild (Grade I) mitral regurgitation. Mild tricuspid regurgitation. Estimated pulmonary artery systolic pressure is 27 mmHg.  Coronary CTA 01/10/2019:  1. Coronary calcium score of 3. This was 107 percentile for age and sex matched control. 2. Normal coronary origin with right dominance. 3. CAD-RADS 1. Minimal non-obstructive CAD (0-24%). Consider non-atherosclerotic causes of chest pain. Consider preventive therapy and risk factor modification.  RADIOLOGY: NA  Risk Assessment/Calculations:   NA   Labs:       Latest Ref Rng & Units 08/31/2016    5:28 PM 05/13/2015    9:31 AM  CBC  WBC 4.0 - 10.5 K/uL 6.0  5.8   Hemoglobin 13.0 - 17.0 g/dL 78.4  69.6   Hematocrit 39.0 - 52.0 % 41.8  46.2   Platelets 150 - 400 K/uL 291  358.0        Latest Ref Rng & Units 09/26/2021    8:09 AM 03/25/2021    7:35 AM 09/17/2020    3:54 PM  BMP  Glucose 70 -  99 mg/dL 409  811  95   BUN 6 - 24 mg/dL 16  17  21    Creatinine 0.76 - 1.27 mg/dL 9.14  7.82  9.56   BUN/Creat Ratio 9 - 20 16  16  24    Sodium 134 - 144 mmol/L 141  139  137   Potassium 3.5 - 5.2 mmol/L 4.3  4.5  4.6   Chloride 96 - 106 mmol/L 101  101  100   CO2 20 - 29 mmol/L 24  26  24    Calcium 8.7 - 10.2 mg/dL 21.3  9.9  9.5       Latest Ref Rng & Units 09/26/2021    8:09 AM 03/25/2021    7:35 AM 09/17/2020    3:54 PM   CMP  Glucose 70 - 99 mg/dL 086  578  95   BUN 6 - 24 mg/dL 16  17  21    Creatinine 0.76 - 1.27 mg/dL 4.69  6.29  5.28   Sodium 134 - 144 mmol/L 141  139  137   Potassium 3.5 - 5.2 mmol/L 4.3  4.5  4.6   Chloride 96 - 106 mmol/L 101  101  100   CO2 20 - 29 mmol/L 24  26  24    Calcium 8.7 - 10.2 mg/dL 41.3  9.9  9.5   Total Protein 6.0 - 8.5 g/dL 7.4  7.5    Total Bilirubin 0.0 - 1.2 mg/dL 0.6  0.7    Alkaline Phos 44 - 121 IU/L 73  71    AST 0 - 40 IU/L 22  73    ALT 0 - 44 IU/L 22  42      Lab Results  Component Value Date   CHOL 213 (H) 09/26/2021   HDL 75 09/26/2021   LDLCALC 124 (H) 09/26/2021   LDLDIRECT 132 (H) 09/26/2021   TRIG 81 09/26/2021   No results for input(s): "LIPOA" in the last 8760 hours. No components found for: "NTPROBNP" No results for input(s): "PROBNP" in the last 8760 hours. No results for input(s): "TSH" in the last 8760 hours.  External Labs: Collected: 04/03/2020 Creatinine 0.92 mg/dL. eGFR: >60 mL/min per 1.73 m Hemoglobin 17 g/dL, hematocrit 24.4% AST 16, ALT 15, alkaline phosphatase 57 Lipid profile: Total cholesterol 273, triglycerides 142, HDL 75, LDL 173   Lipid profile: Collected: 08/22/2020 Total cholesterol 263, triglycerides 68, HDL 82, direct LDL 155, non-HDL 170.  Physical Exam:    Today's Vitals   05/31/23 1544  BP: 122/78  Pulse: 85  Resp: 16  SpO2: 97%  Weight: 209 lb (94.8 kg)  Height: 5\' 11"  (1.803 m)   Body mass index is 29.15 kg/m. Wt Readings from Last 3 Encounters:  05/31/23 209 lb (94.8 kg)  04/20/22 215 lb (97.5 kg)  10/01/21 207 lb (93.9 kg)    Physical Exam  Constitutional: No distress.  hemodynamically stable  Neck: No JVD present.  Cardiovascular: Normal rate, regular rhythm, S1 normal and S2 normal. Exam reveals no gallop, no S3 and no S4.  No murmur heard. Pulmonary/Chest: Effort normal and breath sounds normal. No stridor. He has no wheezes. He has no rales.  Musculoskeletal:        General: No  edema.     Cervical back: Neck supple.  Skin: Skin is warm.     Impression & Recommendation(s):  Impression:   ICD-10-CM   1. Coronary artery calcification of native artery  I25.10 EKG 12-Lead   I25.84 CT CARDIAC  SCORING (SELF PAY ONLY)    2. Essential hypertension  I10     3. Pure hypercholesterolemia  E78.00 Comprehensive metabolic panel    Lipid panel    Lipid panel    Comprehensive metabolic panel    4. Family history of early CAD  Z15.49        Recommendation(s):  Coronary artery calcification of native artery Family history of premature CAD as mentioned above. His last evaluation was approximately 4 to 5 years ago. Recommend repeating a coronary calcium score to evaluate disease progression. Will hold off on a coronary CTA at this time as he is not having any symptoms. Reemphasized importance of improving his modifiable cardiovascular risk factors.  Essential hypertension Office blood pressures are very well-controlled. Continue valsartan 320 mg p.o. daily  Pure hypercholesterolemia Despite elevated levels patient has worked on improving his modifiable cardiovascular risk factors with lifestyle changes and increasing physical activity/exercise.  LDL levels improved to 120 mg/dL.  No recent lab work available for review.  Will check fasting lipids again to reevaluate his lipids.  In the past has been reluctant to be on pharmacological therapy including statins and nonstatin options.  If repeat fasting lipid profile notes elevated LDL levels patient is willing to consider either Zetia or Nexlizet  Orders Placed:  Orders Placed This Encounter  Procedures   CT CARDIAC SCORING (SELF PAY ONLY)    Standing Status:   Future    Number of Occurrences:   1    Expiration Date:   05/30/2024    Preferred imaging location?:   [place]   Comprehensive metabolic panel    Standing Status:   Future    Number of Occurrences:   1    Expected Date:   06/01/2023    Expiration Date:    05/30/2024   Lipid panel    Standing Status:   Future    Number of Occurrences:   1    Expected Date:   06/01/2023    Expiration Date:   05/30/2024   EKG 12-Lead     Final Medication List:   No orders of the defined types were placed in this encounter.   There are no discontinued medications.   Current Outpatient Medications:    aspirin EC 81 MG tablet, Take 81 mg by mouth daily. Swallow whole., Disp: , Rfl:    famotidine (PEPCID) 20 MG tablet, Take 1 tablet (20 mg total) by mouth at bedtime., Disp: , Rfl:    omeprazole (PRILOSEC) 20 MG capsule, Take 1 capsule (20 mg total) by mouth daily. Patient needs follow up appointment for future refills. Please call [phone] to schedule an appointment., Disp: 15 capsule, Rfl: 0   valsartan (DIOVAN) 320 MG tablet, TAKE 1 TABLET BY MOUTH EVERY DAY, Disp: 90 tablet, Rfl: 2  Consent:   NA  Disposition:   1 year follow-up sooner if needed  His questions and concerns were addressed to his satisfaction. He voices understanding of the recommendations provided during this encounter.    Signed, Tessa Lerner, DO, [facility]  [facility]  [address] [place], [place] [address] 06/01/2023 1:24 PM

## 2023-06-01 ENCOUNTER — Encounter: Payer: Self-pay | Admitting: Cardiology

## 2023-06-01 ENCOUNTER — Ambulatory Visit (HOSPITAL_COMMUNITY)
Admission: RE | Admit: 2023-06-01 | Discharge: 2023-06-01 | Disposition: A | Discharge status: Home / Self Care | Payer: Self-pay | Source: Ambulatory Visit | Attending: Cardiology | Admitting: Cardiology

## 2023-06-01 DIAGNOSIS — I2510 Atherosclerotic heart disease of native coronary artery without angina pectoris: Secondary | ICD-10-CM | POA: Insufficient documentation

## 2023-06-01 DIAGNOSIS — I2584 Coronary atherosclerosis due to calcified coronary lesion: Secondary | ICD-10-CM | POA: Insufficient documentation

## 2023-06-03 ENCOUNTER — Encounter: Payer: Self-pay | Admitting: Cardiology

## 2023-06-04 DIAGNOSIS — E7800 Pure hypercholesterolemia, unspecified: Secondary | ICD-10-CM | POA: Diagnosis not present

## 2023-06-04 LAB — COMPREHENSIVE METABOLIC PANEL
ALT: 19 IU/L (ref 0–44)
AST: 18 IU/L (ref 0–40)
Albumin: 4.8 g/dL (ref 4.1–5.1)
Alkaline Phosphatase: 71 IU/L (ref 44–121)
BUN/Creatinine Ratio: 14 (ref 9–20)
BUN: 14 mg/dL (ref 6–24)
Bilirubin Total: 0.5 mg/dL (ref 0.0–1.2)
CO2: 24 mmol/L (ref 20–29)
Calcium: 10.1 mg/dL (ref 8.7–10.2)
Chloride: 104 mmol/L (ref 96–106)
Creatinine, Ser: 1 mg/dL (ref 0.76–1.27)
Globulin, Total: 2.5 g/dL (ref 1.5–4.5)
Glucose: 91 mg/dL (ref 70–99)
Potassium: 4.8 mmol/L (ref 3.5–5.2)
Sodium: 143 mmol/L (ref 134–144)
Total Protein: 7.3 g/dL (ref 6.0–8.5)
eGFR: 93 mL/min/{1.73_m2} (ref >59)

## 2023-06-04 LAB — LIPID PANEL
Chol/HDL Ratio: 2.9 ratio (ref 0.0–5.0)
Cholesterol, Total: 223 mg/dL — ABNORMAL HIGH (ref 100–199)
HDL: 77 mg/dL (ref >39)
LDL Chol Calc (NIH): 128 mg/dL — ABNORMAL HIGH (ref 0–99)
Triglycerides: 104 mg/dL (ref 0–149)
VLDL Cholesterol Cal: 18 mg/dL (ref 5–40)

## 2023-06-14 ENCOUNTER — Encounter: Payer: Self-pay | Admitting: Cardiology

## 2023-12-10 ENCOUNTER — Other Ambulatory Visit: Payer: Self-pay | Admitting: Cardiology

## 2023-12-10 DIAGNOSIS — I10 Essential (primary) hypertension: Secondary | ICD-10-CM

## 2023-12-10 MED ORDER — VALSARTAN 320 MG PO TABS
320.0000 mg | ORAL_TABLET | Freq: Every day | ORAL | 2 refills | Status: AC
Start: 2023-12-10 — End: ?
